# Patient Record
Sex: Female | Born: 1980 | Race: White | Hispanic: No | Marital: Married | Smoking: Never smoker
Health system: Southern US, Community
[De-identification: ages and names within clinical notes are randomized; demographics above are authoritative.]

## PROBLEM LIST (undated history)

## (undated) DIAGNOSIS — G5600 Carpal tunnel syndrome, unspecified upper limb: Secondary | ICD-10-CM

## (undated) DIAGNOSIS — Z87442 Personal history of urinary calculi: Secondary | ICD-10-CM

## (undated) DIAGNOSIS — Z8041 Family history of malignant neoplasm of ovary: Secondary | ICD-10-CM

## (undated) HISTORY — DX: Family history of malignant neoplasm of ovary: Z80.41

## (undated) HISTORY — PX: REDUCTION MAMMAPLASTY: SUR839

## (undated) HISTORY — PX: WISDOM TOOTH EXTRACTION: SHX21

---

## 2014-11-13 LAB — HM PAP SMEAR: HM Pap smear: NORMAL

## 2014-11-29 ENCOUNTER — Emergency Department: Payer: Self-pay | Admitting: Emergency Medicine

## 2015-02-07 ENCOUNTER — Ambulatory Visit
Admit: 2015-02-07 | Disposition: A | Discharge status: Home / Self Care | Payer: Self-pay | Attending: Obstetrics and Gynecology | Admitting: Obstetrics and Gynecology

## 2016-08-20 DIAGNOSIS — N83202 Unspecified ovarian cyst, left side: Secondary | ICD-10-CM

## 2016-08-20 HISTORY — DX: Unspecified ovarian cyst, left side: N83.202

## 2016-08-28 ENCOUNTER — Ambulatory Visit
Admission: RE | Admit: 2016-08-28 | Discharge: 2016-08-28 | Disposition: A | Discharge status: Home / Self Care | Payer: PRIVATE HEALTH INSURANCE | Source: Ambulatory Visit | Attending: Obstetrics and Gynecology | Admitting: Obstetrics and Gynecology

## 2016-08-28 ENCOUNTER — Ambulatory Visit: Payer: PRIVATE HEALTH INSURANCE | Admitting: Certified Registered"

## 2016-08-28 ENCOUNTER — Encounter
Admission: RE | Disposition: A | Discharge status: Home / Self Care | Payer: Self-pay | Source: Ambulatory Visit | Attending: Obstetrics and Gynecology

## 2016-08-28 ENCOUNTER — Encounter: Payer: Self-pay | Admitting: *Deleted

## 2016-08-28 DIAGNOSIS — Z880 Allergy status to penicillin: Secondary | ICD-10-CM | POA: Insufficient documentation

## 2016-08-28 DIAGNOSIS — Z8041 Family history of malignant neoplasm of ovary: Secondary | ICD-10-CM | POA: Insufficient documentation

## 2016-08-28 DIAGNOSIS — N83202 Unspecified ovarian cyst, left side: Secondary | ICD-10-CM | POA: Diagnosis present

## 2016-08-28 HISTORY — PX: LAPAROSCOPIC OVARIAN CYSTECTOMY: SHX6248

## 2016-08-28 HISTORY — DX: Carpal tunnel syndrome, unspecified upper limb: G56.00

## 2016-08-28 HISTORY — PX: LAPAROSCOPY: SHX197

## 2016-08-28 LAB — CBC
HEMATOCRIT: 39.8 % (ref 35.0–47.0)
Hemoglobin: 14.1 g/dL (ref 12.0–16.0)
MCH: 29.9 pg (ref 26.0–34.0)
MCHC: 35.4 g/dL (ref 32.0–36.0)
MCV: 84.4 fL (ref 80.0–100.0)
PLATELETS: 190 10*3/uL (ref 150–440)
RBC: 4.71 MIL/uL (ref 3.80–5.20)
RDW: 14.3 % (ref 11.5–14.5)
WBC: 5.7 10*3/uL (ref 3.6–11.0)

## 2016-08-28 LAB — COMPREHENSIVE METABOLIC PANEL
ALT: 17 U/L (ref 14–54)
AST: 21 U/L (ref 15–41)
Albumin: 4.6 g/dL (ref 3.5–5.0)
Alkaline Phosphatase: 57 U/L (ref 38–126)
Anion gap: 6 (ref 5–15)
BUN: 16 mg/dL (ref 6–20)
CHLORIDE: 105 mmol/L (ref 101–111)
CO2: 26 mmol/L (ref 22–32)
Calcium: 9.3 mg/dL (ref 8.9–10.3)
Creatinine, Ser: 0.74 mg/dL (ref 0.44–1.00)
Glucose, Bld: 93 mg/dL (ref 65–99)
POTASSIUM: 3.9 mmol/L (ref 3.5–5.1)
Sodium: 137 mmol/L (ref 135–145)
TOTAL PROTEIN: 7.9 g/dL (ref 6.5–8.1)
Total Bilirubin: 1.3 mg/dL — ABNORMAL HIGH (ref 0.3–1.2)

## 2016-08-28 LAB — TYPE AND SCREEN
ABO/RH(D): O NEG
Antibody Screen: NEGATIVE

## 2016-08-28 LAB — POCT PREGNANCY, URINE: PREG TEST UR: NEGATIVE

## 2016-08-28 SURGERY — LAPAROSCOPY OPERATIVE
Anesthesia: General

## 2016-08-28 MED ORDER — BUPIVACAINE HCL 0.5 % IJ SOLN
INTRAMUSCULAR | Status: DC | PRN
  Administered 2016-08-28: 10 mL

## 2016-08-28 MED ORDER — MENTHOL 3 MG MT LOZG
1.0000 | LOZENGE | OROMUCOSAL | Status: DC | PRN
Start: 2016-08-28 — End: 2016-08-28
  Filled 2016-08-28: qty 9

## 2016-08-28 MED ORDER — DEXAMETHASONE SODIUM PHOSPHATE 10 MG/ML IJ SOLN
INTRAMUSCULAR | Status: DC | PRN
  Administered 2016-08-28: 4 mg via INTRAVENOUS

## 2016-08-28 MED ORDER — MIDAZOLAM HCL 2 MG/2ML IJ SOLN
INTRAMUSCULAR | Status: DC | PRN
  Administered 2016-08-28: 2 mg via INTRAVENOUS

## 2016-08-28 MED ORDER — FENTANYL CITRATE (PF) 100 MCG/2ML IJ SOLN
INTRAMUSCULAR | Status: AC
  Administered 2016-08-28: 50 ug via INTRAVENOUS
  Filled 2016-08-28: qty 2

## 2016-08-28 MED ORDER — OXYCODONE HCL 5 MG PO TABS: 5.0000 mg | ORAL_TABLET | Freq: Once | ORAL | Status: DC | PRN

## 2016-08-28 MED ORDER — PROPOFOL 10 MG/ML IV BOLUS
INTRAVENOUS | Status: DC | PRN
  Administered 2016-08-28: 160 mg via INTRAVENOUS

## 2016-08-28 MED ORDER — NEOSTIGMINE METHYLSULFATE 10 MG/10ML IV SOLN
INTRAVENOUS | Status: DC | PRN
  Administered 2016-08-28: 4 mg via INTRAVENOUS

## 2016-08-28 MED ORDER — BUPIVACAINE HCL (PF) 0.5 % IJ SOLN
INTRAMUSCULAR | Status: AC
  Filled 2016-08-28: qty 30

## 2016-08-28 MED ORDER — ROCURONIUM BROMIDE 100 MG/10ML IV SOLN
INTRAVENOUS | Status: DC | PRN
  Administered 2016-08-28: 40 mg via INTRAVENOUS
  Administered 2016-08-28: 10 mg via INTRAVENOUS
  Administered 2016-08-28: 5 mg via INTRAVENOUS

## 2016-08-28 MED ORDER — HYDROCODONE-ACETAMINOPHEN 5-325 MG PO TABS: 1.0000 | ORAL_TABLET | ORAL | 0 refills | Status: DC | PRN

## 2016-08-28 MED ORDER — OXYCODONE HCL 5 MG/5ML PO SOLN: 5.0000 mg | Freq: Once | ORAL | Status: DC | PRN

## 2016-08-28 MED ORDER — LACTATED RINGERS IV SOLN
INTRAVENOUS | Status: DC
  Administered 2016-08-28: 10:00:00 via INTRAVENOUS

## 2016-08-28 MED ORDER — GLYCOPYRROLATE 0.2 MG/ML IJ SOLN
INTRAMUSCULAR | Status: DC | PRN
  Administered 2016-08-28: .8 mg via INTRAVENOUS

## 2016-08-28 MED ORDER — FENTANYL CITRATE (PF) 100 MCG/2ML IJ SOLN
INTRAMUSCULAR | Status: DC | PRN
  Administered 2016-08-28: 100 ug via INTRAVENOUS
  Administered 2016-08-28: 50 ug via INTRAVENOUS
  Administered 2016-08-28: 25 ug via INTRAVENOUS
  Administered 2016-08-28: 50 ug via INTRAVENOUS

## 2016-08-28 MED ORDER — PROMETHAZINE HCL 25 MG/ML IJ SOLN: 6.2500 mg | INTRAMUSCULAR | Status: DC | PRN

## 2016-08-28 MED ORDER — LIDOCAINE HCL (CARDIAC) 20 MG/ML IV SOLN
INTRAVENOUS | Status: DC | PRN
  Administered 2016-08-28: 80 mg via INTRAVENOUS

## 2016-08-28 MED ORDER — FENTANYL CITRATE (PF) 100 MCG/2ML IJ SOLN
25.0000 ug | INTRAMUSCULAR | Status: DC | PRN
  Administered 2016-08-28 (×3): 50 ug via INTRAVENOUS

## 2016-08-28 MED ORDER — ONDANSETRON 4 MG PO TBDP: 4.0000 mg | ORAL_TABLET | Freq: Three times a day (TID) | ORAL | 0 refills | Status: DC | PRN

## 2016-08-28 MED ORDER — KETOROLAC TROMETHAMINE 30 MG/ML IJ SOLN
INTRAMUSCULAR | Status: DC | PRN
  Administered 2016-08-28: 30 mg via INTRAVENOUS

## 2016-08-28 MED ORDER — ONDANSETRON HCL 4 MG/2ML IJ SOLN
INTRAMUSCULAR | Status: DC | PRN
  Administered 2016-08-28: 4 mg via INTRAVENOUS

## 2016-08-28 MED ORDER — SODIUM CHLORIDE 0.9 % IJ SOLN
INTRAMUSCULAR | Status: AC
  Filled 2016-08-28: qty 50

## 2016-08-28 MED ORDER — VASOPRESSIN 20 UNIT/ML IV SOLN
INTRAVENOUS | Status: AC
  Filled 2016-08-28: qty 1

## 2016-08-28 MED ORDER — MEPERIDINE HCL 25 MG/ML IJ SOLN: 6.2500 mg | INTRAMUSCULAR | Status: DC | PRN

## 2016-08-28 MED ORDER — METHYLENE BLUE 0.5 % INJ SOLN
INTRAVENOUS | Status: AC
  Filled 2016-08-28: qty 10

## 2016-08-28 MED ORDER — IBUPROFEN 600 MG PO TABS: 600.0000 mg | ORAL_TABLET | Freq: Four times a day (QID) | ORAL | 0 refills | Status: DC | PRN

## 2016-08-28 SURGICAL SUPPLY — 81 items
BAG URINE DRAINAGE (UROLOGICAL SUPPLIES) ×4 IMPLANT
BAG URO DRAIN 2000ML W/SPOUT (MISCELLANEOUS) ×4 IMPLANT
BLADE SURG SZ11 CARB STEEL (BLADE) ×4 IMPLANT
CANISTER SUCT 1200ML W/VALVE (MISCELLANEOUS) ×4 IMPLANT
CATH FOL 2WAY LX 16X5 (CATHETERS) ×4 IMPLANT
CATH FOL LEG HOLDER (MISCELLANEOUS) ×4 IMPLANT
CATH FOLEY 2WAY  5CC 16FR (CATHETERS) ×2
CATH ROBINSON RED A/P 16FR (CATHETERS) IMPLANT
CATH TRAY 16F METER LATEX (MISCELLANEOUS) ×4 IMPLANT
CATH URTH 16FR FL 2W BLN LF (CATHETERS) ×2 IMPLANT
CHLORAPREP W/TINT 26ML (MISCELLANEOUS) IMPLANT
CLOSURE WOUND 1/2 X4 (GAUZE/BANDAGES/DRESSINGS) ×1
DRAPE LAPAROTOMY 100X77 ABD (DRAPES) IMPLANT
DRAPE LAPAROTOMY TRNSV 106X77 (MISCELLANEOUS) IMPLANT
DRAPE LEGGINS SURG 28X43 STRL (DRAPES) ×4 IMPLANT
DRAPE SHEET LG 3/4 BI-LAMINATE (DRAPES) ×4 IMPLANT
DRAPE UNDER BUTTOCK W/FLU (DRAPES) ×4 IMPLANT
DRESSING SURGICEL FIBRLLR 1X2 (HEMOSTASIS) ×2 IMPLANT
DRSG SURGICEL FIBRILLAR 1X2 (HEMOSTASIS) ×4
DRSG TELFA 3X8 NADH (GAUZE/BANDAGES/DRESSINGS) ×4 IMPLANT
ELECT BLADE 6 FLAT ULTRCLN (ELECTRODE) IMPLANT
ELECT CAUTERY BLADE 6.4 (BLADE) IMPLANT
ELECT REM PT RETURN 9FT ADLT (ELECTROSURGICAL) ×4
ELECTRODE REM PT RTRN 9FT ADLT (ELECTROSURGICAL) ×2 IMPLANT
GAUZE SPONGE 4X4 12PLY STRL (GAUZE/BANDAGES/DRESSINGS) ×4 IMPLANT
GLOVE BIO SURGEON STRL SZ7 (GLOVE) ×12 IMPLANT
GLOVE BIO SURGEON STRL SZ8 (GLOVE) ×4 IMPLANT
GLOVE BIOGEL PI IND STRL 7.5 (GLOVE) ×6 IMPLANT
GLOVE BIOGEL PI INDICATOR 7.5 (GLOVE) ×6
GOWN STRL REUS W/ TWL LRG LVL3 (GOWN DISPOSABLE) ×6 IMPLANT
GOWN STRL REUS W/ TWL XL LVL3 (GOWN DISPOSABLE) ×2 IMPLANT
GOWN STRL REUS W/TWL LRG LVL3 (GOWN DISPOSABLE) ×6
GOWN STRL REUS W/TWL XL LVL3 (GOWN DISPOSABLE) ×2
IRRIGATION STRYKERFLOW (MISCELLANEOUS) ×2 IMPLANT
IRRIGATOR STRYKERFLOW (MISCELLANEOUS) ×4
IV LACTATED RINGERS 1000ML (IV SOLUTION) ×4 IMPLANT
IV NS 100ML SINGLE PACK (IV SOLUTION) ×4 IMPLANT
KIT RM TURNOVER CYSTO AR (KITS) ×4 IMPLANT
LABEL OR SOLS (LABEL) ×4 IMPLANT
LIQUID BAND (GAUZE/BANDAGES/DRESSINGS) ×4 IMPLANT
MANIPULATOR VCARE STD CRV RETR (MISCELLANEOUS) ×4 IMPLANT
NDL SAFETY 22GX1.5 (NEEDLE) ×4 IMPLANT
NEEDLE HYPO 25GX1X1/2 BEV (NEEDLE) ×4 IMPLANT
NEEDLE MAYO 6 CRC TAPER PT (NEEDLE) ×4 IMPLANT
NS IRRIG 1000ML POUR BTL (IV SOLUTION) ×4 IMPLANT
NS IRRIG 500ML POUR BTL (IV SOLUTION) ×4 IMPLANT
PACK BASIN MAJOR ARMC (MISCELLANEOUS) ×4 IMPLANT
PACK LAP CHOLECYSTECTOMY (MISCELLANEOUS) ×4 IMPLANT
PAD OB MATERNITY 4.3X12.25 (PERSONAL CARE ITEMS) ×4 IMPLANT
PAD PREP 24X41 OB/GYN DISP (PERSONAL CARE ITEMS) ×4 IMPLANT
POUCH ENDO CATCH 10MM SPEC (MISCELLANEOUS) ×4 IMPLANT
SCISSORS METZENBAUM CVD 33 (INSTRUMENTS) ×4 IMPLANT
SHEARS HARMONIC ACE PLUS 36CM (ENDOMECHANICALS) IMPLANT
SLEEVE ENDOPATH XCEL 5M (ENDOMECHANICALS) ×4 IMPLANT
SOL PREP PVP 2OZ (MISCELLANEOUS) ×4
SOLUTION PREP PVP 2OZ (MISCELLANEOUS) ×2 IMPLANT
SPONGE LAP 18X18 5 PK (GAUZE/BANDAGES/DRESSINGS) ×4 IMPLANT
STAPLER SKIN PROX 35W (STAPLE) ×4 IMPLANT
STRIP CLOSURE SKIN 1/2X4 (GAUZE/BANDAGES/DRESSINGS) ×3 IMPLANT
SURGILUBE 2OZ TUBE FLIPTOP (MISCELLANEOUS) ×4 IMPLANT
SUT ETHIBOND CT1 BRD #0 30IN (SUTURE) IMPLANT
SUT MNCRL 3-0 UNDYED SH (SUTURE) ×2 IMPLANT
SUT MNCRL 4-0 (SUTURE)
SUT MNCRL 4-0 27XMFL (SUTURE)
SUT MONOCRYL 3-0 UNDYED (SUTURE) ×2
SUT VIC AB 0 CT1 27 (SUTURE)
SUT VIC AB 0 CT1 27XCR 8 STRN (SUTURE) IMPLANT
SUT VIC AB 0 CT1 36 (SUTURE) ×4 IMPLANT
SUT VIC AB 1 CT1 36 (SUTURE) IMPLANT
SUT VIC AB 2-0 CT1 (SUTURE) ×4 IMPLANT
SUT VIC AB 2-0 UR6 27 (SUTURE) ×4 IMPLANT
SUT VIC AB 4-0 PS2 18 (SUTURE) IMPLANT
SUTURE MNCRL 4-0 27XMF (SUTURE) IMPLANT
SYR 30ML LL (SYRINGE) ×4 IMPLANT
SYR 50ML LL SCALE MARK (SYRINGE) ×4 IMPLANT
SYRINGE 10CC LL (SYRINGE) ×4 IMPLANT
TRAY PREP VAG/GEN (MISCELLANEOUS) ×4 IMPLANT
TROCAR ENDO BLADELESS 11MM (ENDOMECHANICALS) ×4 IMPLANT
TROCAR XCEL NON-BLD 5MMX100MML (ENDOMECHANICALS) ×4 IMPLANT
TROCAR XCEL UNIV SLVE 11M 100M (ENDOMECHANICALS) IMPLANT
TUBING INSUFFLATOR HI FLOW (MISCELLANEOUS) ×4 IMPLANT

## 2016-08-28 NOTE — Transfer of Care (Signed)
Immediate Anesthesia Transfer of Care Note  Patient: Natalie Wiggins  Procedure(s) Performed: Procedure(s): LAPAROSCOPY OPERATIVE (N/A) LAPAROSCOPIC OVARIAN CYSTECTOMY (Left)  Patient Location: PACU  Anesthesia Type:General  Level of Consciousness: sedated  Airway & Oxygen Therapy: Patient Spontanous Breathing and Patient connected to face mask oxygen  Post-op Assessment: Report given to RN and Post -op Vital signs reviewed and stable  Post vital signs: Reviewed and stable  Last Vitals:  Vitals:   08/28/16 1210 08/28/16 1212  BP:  108/68  Pulse:  71  Resp: (P) 18 19  Temp: (P) 36.2 C     Last Pain:  Vitals:   08/28/16 0921  TempSrc: Tympanic         Complications: No apparent anesthesia complications

## 2016-08-28 NOTE — Anesthesia Postprocedure Evaluation (Signed)
Anesthesia Post Note  Patient: Natalie Wiggins  Procedure(s) Performed: Procedure(s) (LRB): LAPAROSCOPY OPERATIVE (N/A) LAPAROSCOPIC OVARIAN CYSTECTOMY (Left)  Patient location during evaluation: PACU Anesthesia Type: General Level of consciousness: awake and alert and oriented Pain management: pain level controlled Vital Signs Assessment: post-procedure vital signs reviewed and stable Respiratory status: spontaneous breathing, nonlabored ventilation and respiratory function stable Cardiovascular status: blood pressure returned to baseline and stable Postop Assessment: no signs of nausea or vomiting Anesthetic complications: no    Last Vitals:  Vitals:   08/28/16 1353 08/28/16 1436  BP: 104/65 (!) 98/59  Pulse: 78 87  Resp: 16 16  Temp: 36.5 C     Last Pain:  Vitals:   08/28/16 1436  TempSrc:   PainSc: 0-No pain                 Kathee Tumlin

## 2016-08-28 NOTE — Op Note (Signed)
Operative Note    Pre-Op Diagnosis: Left ovarian cyst  Post-Op Diagnosis: Left ovarian cyst  Procedures:  1. Diagnostic laparoscopy 2. Laparoscopic left ovarian cystectomy  Primary Surgeon: Prentice Docker, MD   Assistant Surgeon: Humberto Leep, MD  EBL: 50 mL   IVF: 600 mL   Urine output: 400 mL clear urine at end of case  Specimens:  1) pelvic washings 2) left ovarian cyst wall  Drains: None  Complications: None   Disposition: PACU   Condition: Stable   Findings:  1) Normal appearing uterus, cervix, fallopian tubes, and right ovary 2) left ovary with approximately 7cm cyst with single cavity and clear, yellow fluid 3) Normal-appearing appendix  Procedure Summary:  The patient was taken to the operating room where general anesthesia was administered and found to be adequate. She was placed in the dorsal supine lithotomy position in Wallace stirrups and prepped and draped in usual sterile fashion. After a timeout was called an indwelling catheter was placed in her bladder. A sterile speculum was placed in the vagina and a single-tooth tenaculum was used to grasp the anterior lip of the cervix. An V-Care uterine manipulator was gentle introduce into the uterine cavity in accordance with the manufacturer's recommendations after dilation with Kennon Rounds dilators.  Prior to this the the tenaculum was removed. The speculum was removed from the vagina.  Attention was turned to the abdomen where after injection of local anesthetic, a 5 mm infraumbilical incision was made with the scalpel. Entry into the abdomen was obtained via Optiview trocar technique (a blunt entry technique with camera visualization through the obturator upon entry). Verification of entry into the abdomen was obtained using opening pressures. The abdomen was insufflated with CO2. The camera was introduced through the trocar with verification of atraumatic entry.  An 75mm right lower quadrant port site was created  under direct intra-abdominal camera visualization without issue.  I left lower quadrant 58mm port was placed in a similar fashion without difficulty.  A pelvic washing sample was taken at this point. A general inspection of the abdomen was undertaken at this point with the above-noted findings.  The left ovary was elevated from the pelvis and inspected and found to be free of adhesions.  Using the monopolar scissors the ovarian capsule was scored and then opened.  The cyst wall was revealed.  The ovarian capsule was further opened and dissection of the ovarian cyst wall from the ovarian wall was undertaken. At one particularly shallow point from the cyst wall the capsule wall ruptured.  Suction was utilized the evacuate the contents of the cyst.  The remained of the cyst wall was dissected carefully from the wall of the ovary.  Once the cyst wall was liberated it was removed intact through the 11 mm port site.  Hemostasis was obtained on the exposed ovarian wall with monopolar electrocautery. The remaining ovarian cavity was packed with Fibrillar to maintain hemostasis.  Copious abdominal and pelvic irrigation and evacuation was undertaken.   The 33mm right lower quadrant trocar was removed and the fascia was closed using 2-0 vicryl in a single interrupted stitch.  The abdomen and pelvis were desuflated of CO2. Five deep breaths were given to the patient to maximize CO2 removal.  The remaining two trocars were removed.  The right lower quadrant port skin site was closed in a subcuticular fashion with 4-0 monocryl.  The umbilical port site was reapproximated with a single vertical 4-0 monocryl stitch. The left lower quadrant port site was  reapproximated using a single subcuticular stitch. All port sites were reinforced using surgical skin glue.    The uterine manipulator was removed with hemostasis noted from the cervix and from the tenaculum entry sites. The Foley catheter was removed.  The vaginal was inspected  to ensure no remaining sponges or instruments were present.  The patient tolerated the procedure well.  Sponge, lap, needle, and instrument counts were correct x 2.  VTE prophylaxis: SCDs. Antibiotic prophylaxis: none indicated nor given. She was awakened in the operating room and was taken to the PACU in stable condition.   Prentice Docker, MD 08/28/2016 12:00 PM

## 2016-08-28 NOTE — Anesthesia Preprocedure Evaluation (Signed)
Anesthesia Evaluation  Patient identified by MRN, date of birth, ID band Patient awake    Reviewed: Allergy & Precautions, NPO status , Patient's Chart, lab work & pertinent test results  History of Anesthesia Complications Negative for: history of anesthetic complications  Airway Mallampati: I  TM Distance: >3 FB Neck ROM: Full    Dental no notable dental hx.    Pulmonary neg pulmonary ROS, neg sleep apnea, neg COPD,    breath sounds clear to auscultation- rhonchi (-) wheezing      Cardiovascular Exercise Tolerance: Good (-) hypertension(-) CAD and (-) Past MI  Rhythm:Regular Rate:Normal - Systolic murmurs and - Diastolic murmurs    Neuro/Psych negative psych ROS   GI/Hepatic negative GI ROS, Neg liver ROS,   Endo/Other  negative endocrine ROSneg diabetes  Renal/GU negative Renal ROS     Musculoskeletal negative musculoskeletal ROS (+)   Abdominal (+) - obese,   Peds  Hematology negative hematology ROS (+)   Anesthesia Other Findings   Reproductive/Obstetrics                             Anesthesia Physical Anesthesia Plan  ASA: I  Anesthesia Plan: General   Post-op Pain Management:    Induction: Intravenous  Airway Management Planned: Oral ETT  Additional Equipment:   Intra-op Plan:   Post-operative Plan: Extubation in OR  Informed Consent: I have reviewed the patients History and Physical, chart, labs and discussed the procedure including the risks, benefits and alternatives for the proposed anesthesia with the patient or authorized representative who has indicated his/her understanding and acceptance.   Dental advisory given  Plan Discussed with: CRNA and Anesthesiologist  Anesthesia Plan Comments:         Anesthesia Quick Evaluation

## 2016-08-28 NOTE — H&P (Signed)
GYNECOLOGIC SURGERY ADMISSION HISTORY AND PHYSICAL NOTE    Attending Provider: Will Bonnet, MD   Natalie Wiggins UK:[id number] 08/28/2016 9:45 AM    Chief Complaint:   Natalie Wiggins is a 35 y.o. G27P2002 female with left lower quadrant abdominal pain and a persistent left ovarian cyst who presents for surgical management.  History of Present Ilness:   She has had a cyst on her left ovary for at least two years.  She had several episodes of cyst rupture in late 2015.  She was scheduled to see her OB/GYN about her cyst when she found out that she was pregnant.  Her cyst was about 7cm in the largest dimension prior to and during the early part of her last pregnancy.  After delivering in October 2016, she continued to have symptoms.  In 09/2015 she had a pelvic ultrasound that showed a complex left ovarian cyst measuring 5.6 x 4.2 x 4.8cm.  In 12/2015, she had a repeat ultrasound that showed the cyst measured 6.1 x 5.1 x 5.9 cm and still was described as complex.  On 05/21/2016, the cyst measured 7.0 x 5.4 x 6.4 cm.  She had a CA-125 of 7.5 (normal) in 09/2015.  The CA-125 level was 7.4 in 12/2015.  She was first seen by me in August 2017 for a second opinion.  She presented with worsening symptoms.  Her pain is on her left lower quadrant.  She had another ultrasound this past week showing cyst measuring 7.3 x 3.7 x 6.8 cm and described as complex.  The uterus is anteverted.  She continues to have discomfort and pain.  More functions of daily life are becoming more difficult and painful, including intercourse.  At this point, the cyst has been present for at least two years and appears to be causing a worsening quality of life. She desires to have the cyst removed after a considerable period of observation and conservative management.    Past Medical History:  Diagnosis Date  . Carpal tunnel syndrome    Past Surgical History:  Procedure Laterality Date  . WISDOM TOOTH EXTRACTION     Allergies   Allergen Reactions  . Amoxicillin Hives   Prior to Admission medications   Medication Sig Start Date End Date Taking? Authorizing Provider  b complex vitamins tablet Take 1 tablet by mouth daily.   Yes Historical Provider, MD  Prenatal Vit-Fe Fumarate-FA (MULTIVITAMIN-PRENATAL) 27-0.8 MG TABS tablet Take 1 tablet by mouth daily at 12 noon.   Yes Historical Provider, MD    Obstetric History: She is a G57P2002 female s/p SVD x 2.   Social History:  She  reports that she has never smoked. She has never used smokeless tobacco. She reports that she drinks alcohol. She reports that she does not use drugs.  Family History:  family history includes Hypertension in her father; Ovarian cancer in her maternal aunt.   Review of Systems:  Pertinent positives noted in HPI, otherwise negative x 10 systems reviewed.    Objective    BP 117/87   Pulse 80   Temp 98 F (36.7 C) (Tympanic)   Resp 16   Ht 5\' 6"  (1.676 m)   Wt 150 lb (68 kg)   LMP 08/01/2016   SpO2 100%   BMI 24.21 kg/m  Physical Exam  General:  She is a well appearing female in no distress.  HEENT:  Normocephalic, atraumatic.   Neck:  supple, no lymphadenopathy Cardiac:  RRR Pulmonary:  Clear to auscultation  bilaterally. No wheezes, rales, rhonchi.   Abdomen:  Soft, nontender, nondistended, +BS, no rebound or guarding  Pelvic:  Deferred Extremities:  Non-tender, symmetric no edema bilaterally.   Neurologic:  Alert & oriented x 3.  Appropriate, conversant.    Laboratory Results:   Lab Results  Component Value Date   WBC 5.7 08/28/2016   RBC 4.71 08/28/2016   HGB 14.1 08/28/2016   HCT 39.8 08/28/2016   PLT 190 08/28/2016   Recent Labs     08/28/16  0854  GLUCOSE  93  NA  137  K  3.9  CREATININE  0.74      Lab Results  Component Value Date   PREGTESTUR NEGATIVE 08/28/2016   Assessment & Plan   Natalie Wiggins is a 35 y.o. G40P2002 female with a persistent left ovarian cyst.    Plan:  1.  Surgical removal of  cyst by laparoscopy, possible need to remove the fallopian tube and entire ovary.  Possible need to perform by laparotomy.   2.  See clinic note for more detailed discussion of management options.  Will Bonnet, MD 08/28/2016 9:45 AM

## 2016-08-28 NOTE — Anesthesia Procedure Notes (Signed)
Procedure Name: Intubation Performed by: Aren Cherne Pre-anesthesia Checklist: Patient identified, Patient being monitored, Timeout performed, Emergency Drugs available and Suction available Patient Re-evaluated:Patient Re-evaluated prior to inductionOxygen Delivery Method: Circle system utilized Preoxygenation: Pre-oxygenation with 100% oxygen Intubation Type: IV induction Ventilation: Mask ventilation without difficulty Laryngoscope Size: Mac and 3 Grade View: Grade I Tube type: Oral Tube size: 7.0 mm Number of attempts: 1 Airway Equipment and Method: Stylet Placement Confirmation: ETT inserted through vocal cords under direct vision,  positive ETCO2 and breath sounds checked- equal and bilateral Secured at: 21 cm Tube secured with: Tape Dental Injury: Teeth and Oropharynx as per pre-operative assessment        

## 2016-08-28 NOTE — Discharge Instructions (Signed)

## 2016-08-29 LAB — CYTOLOGY - NON PAP

## 2016-08-29 LAB — SURGICAL PATHOLOGY

## 2016-09-04 ENCOUNTER — Inpatient Hospital Stay: Admission: RE | Admit: 2016-09-04 | Payer: Self-pay | Source: Ambulatory Visit

## 2016-10-20 HISTORY — PX: AUGMENTATION MAMMAPLASTY: SUR837

## 2017-03-03 ENCOUNTER — Ambulatory Visit (INDEPENDENT_AMBULATORY_CARE_PROVIDER_SITE_OTHER): Payer: PRIVATE HEALTH INSURANCE | Admitting: Obstetrics and Gynecology

## 2017-03-03 ENCOUNTER — Ambulatory Visit: Payer: Self-pay | Admitting: Obstetrics and Gynecology

## 2017-03-03 ENCOUNTER — Encounter: Payer: Self-pay | Admitting: Obstetrics and Gynecology

## 2017-03-03 ENCOUNTER — Telehealth: Payer: Self-pay | Admitting: Obstetrics and Gynecology

## 2017-03-03 VITALS — BP 118/74 | Ht 67.0 in | Wt 156.0 lb

## 2017-03-03 DIAGNOSIS — Z308 Encounter for other contraceptive management: Secondary | ICD-10-CM | POA: Diagnosis not present

## 2017-03-03 DIAGNOSIS — N921 Excessive and frequent menstruation with irregular cycle: Secondary | ICD-10-CM

## 2017-03-03 NOTE — Telephone Encounter (Signed)
Pt coming 5/30 at 2:10 with SDJ for mirena insert

## 2017-03-03 NOTE — Telephone Encounter (Signed)
Noted. Will order to arrive by apt date/time. 

## 2017-03-03 NOTE — Progress Notes (Signed)
Obstetrics & Gynecology Office Visit   Chief Complaint  Patient presents with  . Menstrual Problem    pt c/o "horrible cycles"   History of Present Illness: 36 y.o. G51P2002 female who presents to discussed several months of worsening, irregular painful and heavy menstrual bleeding.  She associates headaches and back pain.  This has all been going on since early this year.  She also would like to discuss contraception. She is interested in long-active reversible forms.   Review of Systems  Constitutional: Negative.   HENT: Negative.   Eyes: Negative.   Respiratory: Negative.   Cardiovascular: Negative.   Gastrointestinal: Negative.   Genitourinary:       Per HPI  Musculoskeletal: Negative.   Skin: Negative.   Neurological: Negative.   Psychiatric/Behavioral: Negative.     Past Medical History:  Diagnosis Date  . Carpal tunnel syndrome     Past Surgical History:  Procedure Laterality Date  . LAPAROSCOPIC OVARIAN CYSTECTOMY Left 08/28/2016   Procedure: LAPAROSCOPIC OVARIAN CYSTECTOMY;  Surgeon: Will Bonnet, MD;  Location: [facility] ORS;  Service: Gynecology;  Laterality: Left;  . LAPAROSCOPY N/A 08/28/2016   Procedure: LAPAROSCOPY OPERATIVE;  Surgeon: Will Bonnet, MD;  Location: [facility] ORS;  Service: Gynecology;  Laterality: N/A;  . WISDOM TOOTH EXTRACTION      Gynecologic History: Patient's last menstrual period was 02/23/2017.  Obstetric History: [id number]  Family History  Problem Relation Age of Onset  . Hypertension Father   . Ovarian cancer Maternal Aunt     Social History   Social History  . Marital status: Married    Spouse name: N/A  . Number of children: N/A  . Years of education: N/A   Occupational History  . Not on file.   Social History Main Topics  . Smoking status: Never Smoker  . Smokeless tobacco: Never Used  . Alcohol use Yes  . Drug use: No  . Sexual activity: Yes   Other Topics Concern  . Not on file   Social History Narrative    . No narrative on file    Allergies  Allergen Reactions  . Amoxicillin Hives    Medications:   Medication Sig Start Date End Date Taking? Authorizing Provider  magnesium 30 MG tablet Take 30 mg by mouth 2 (two) times daily.   Yes [provider]  Prenatal Vit-Fe Fumarate-FA (MULTIVITAMIN-PRENATAL) 27-0.8 MG TABS tablet Take 1 tablet by mouth daily at 12 noon.   Yes [provider]  b complex vitamins tablet Take 1 tablet by mouth daily.    [provider]    Physical Exam BP 118/74   Ht 5\' 7"  (1.702 m)   Wt 156 lb (70.8 kg)   LMP 02/23/2017   BMI 24.43 kg/m  Patient's last menstrual period was 02/23/2017. Physical Exam  Constitutional: She is oriented to person, place, and time and well-developed, well-nourished, and in no distress. No distress.  HENT:  Head: Normocephalic and atraumatic.  Pulmonary/Chest: Effort normal. No respiratory distress.  Neurological: She is alert and oriented to person, place, and time.  Psychiatric: Mood, affect and judgment normal.     Assessment: 36 y.o. I4P3295 Menorrhagia with irregular cycle Will perform several labs (CBC, TSH/FT4, prolactin) to get initial assessment.  Ultrasound, if normal for structural issues, though patient has had pelvic imaging about 6 months ago.  Lower on the differential is a cervical polyp. Will assess this when placing IUD.  IUD may also resolve her bleeding issues in the absence  of other obvious etiology.   Encounter for other contraceptive management After discussion of options, patient desires intrauterine device. Will place Mirena at next appointment pending ultrasound findings.    Plan: Problem List Items Addressed This Visit    Encounter for other contraceptive management    After discussion of options, patient desires intrauterine device. Will place Mirena at next appointment pending ultrasound findings.       Menorrhagia with irregular cycle - Primary    Will perform  several labs (CBC, TSH/FT4, prolactin) to get initial assessment.  Ultrasound, if normal for structural issues, though patient has had pelvic imaging about 6 months ago.  Lower on the differential is a cervical polyp. Will assess this when placing IUD.  IUD may also resolve her bleeding issues in the absence of other obvious etiology.       Relevant Orders   Prolactin (Completed)   TSH + free T4 (Completed)   CBC (Completed)   US Transvaginal Non-OB      Return in about 4 weeks (around 03/31/2017) for Mirena IUD placement. and pelvic ultrasound  Prentice Docker, MD 03/10/2017 10:28 AM

## 2017-03-04 LAB — PROLACTIN: PROLACTIN: 4.2 ng/mL — AB (ref 4.8–23.3)

## 2017-03-04 LAB — CBC
HEMATOCRIT: 39.5 % (ref 34.0–46.6)
HEMOGLOBIN: 13.4 g/dL (ref 11.1–15.9)
MCH: 29.3 pg (ref 26.6–33.0)
MCHC: 33.9 g/dL (ref 31.5–35.7)
MCV: 86 fL (ref 79–97)
Platelets: 206 10*3/uL (ref 150–379)
RBC: 4.58 x10E6/uL (ref 3.77–5.28)
RDW: 13.9 % (ref 12.3–15.4)
WBC: 5.3 10*3/uL (ref 3.4–10.8)

## 2017-03-04 LAB — TSH+FREE T4
FREE T4: 1.03 ng/dL (ref 0.82–1.77)
TSH: 1.27 u[IU]/mL (ref 0.450–4.500)

## 2017-03-05 ENCOUNTER — Encounter: Payer: Self-pay | Admitting: Obstetrics and Gynecology

## 2017-03-10 DIAGNOSIS — Z308 Encounter for other contraceptive management: Secondary | ICD-10-CM | POA: Insufficient documentation

## 2017-03-10 DIAGNOSIS — N921 Excessive and frequent menstruation with irregular cycle: Secondary | ICD-10-CM | POA: Insufficient documentation

## 2017-03-10 NOTE — Assessment & Plan Note (Signed)
Will perform several labs (CBC, TSH/FT4, prolactin) to get initial assessment.  Ultrasound, if normal for structural issues, though patient has had pelvic imaging about 6 months ago.  Lower on the differential is a cervical polyp. Will assess this when placing IUD.  IUD may also resolve her bleeding issues in the absence of other obvious etiology.

## 2017-03-10 NOTE — Assessment & Plan Note (Signed)
After discussion of options, patient desires intrauterine device. Will place Mirena at next appointment pending ultrasound findings.

## 2017-03-18 ENCOUNTER — Ambulatory Visit: Payer: Self-pay | Admitting: Obstetrics and Gynecology

## 2017-03-18 ENCOUNTER — Ambulatory Visit (INDEPENDENT_AMBULATORY_CARE_PROVIDER_SITE_OTHER): Payer: PRIVATE HEALTH INSURANCE | Admitting: Obstetrics and Gynecology

## 2017-03-18 ENCOUNTER — Ambulatory Visit (INDEPENDENT_AMBULATORY_CARE_PROVIDER_SITE_OTHER): Payer: PRIVATE HEALTH INSURANCE

## 2017-03-18 ENCOUNTER — Ambulatory Visit: Payer: PRIVATE HEALTH INSURANCE | Admitting: Obstetrics and Gynecology

## 2017-03-18 VITALS — BP 118/74 | Ht 67.0 in | Wt 149.0 lb

## 2017-03-18 DIAGNOSIS — N921 Excessive and frequent menstruation with irregular cycle: Secondary | ICD-10-CM | POA: Diagnosis not present

## 2017-03-18 DIAGNOSIS — Z30430 Encounter for insertion of intrauterine contraceptive device: Secondary | ICD-10-CM | POA: Diagnosis not present

## 2017-03-18 NOTE — Telephone Encounter (Signed)
Mirena Stock reserved for this patient.

## 2017-03-18 NOTE — Progress Notes (Signed)
IUD Insertion Procedure Note Patient identified, informed consent performed, consent signed.   Discussed risks of irregular bleeding, cramping, infection, malpositioning, expulsion or uterine perforation of the IUD (1:1000 placements)  which may require further procedure such as laparoscopy.  IUD while effective at preventing pregnancy do not prevent transmission of sexually transmitted diseases and use of barrier methods for this purpose was discussed. Time out was performed.  Urine pregnancy test negative.  Speculum placed in the vagina.  Cervix visualized.  Cleaned with Betadine x 2.  Grasped anteriorly with a single tooth tenaculum.  Uterus sounded to 7 cm. IUD placed per manufacturer's recommendations.  Strings trimmed to 3 cm. Tenaculum was removed, good hemostasis noted.  Patient tolerated procedure well.   Patient was given post-procedure instructions.  She was advised to have backup contraception for one week.  Patient was also asked to check IUD strings periodically and follow up in 4 weeks for IUD check.   Prentice Docker, MD 03/18/2017 1:38 PM

## 2017-03-26 ENCOUNTER — Telehealth: Payer: Self-pay

## 2017-03-26 NOTE — Telephone Encounter (Signed)
Pt calling - had mirena placed a week and a half ago and is still having issues with it.  Bleeding, daily headaches that don't completely go away c medication.  She can feel exactly where the IUD is on left side - has felt it every day since insertion.  Went to [employer] and was uncomfortable the whole ride down there and back.  Adv it takes three months for the body to adjust to new bc - that could be where the h/as are coming from.  The irreg bleeding is normal as body adjusts. As far as it being uncomfortable, appt made for issues.

## 2017-03-30 ENCOUNTER — Ambulatory Visit (INDEPENDENT_AMBULATORY_CARE_PROVIDER_SITE_OTHER): Payer: PRIVATE HEALTH INSURANCE

## 2017-03-30 ENCOUNTER — Encounter: Payer: Self-pay | Admitting: Obstetrics and Gynecology

## 2017-03-30 ENCOUNTER — Ambulatory Visit (INDEPENDENT_AMBULATORY_CARE_PROVIDER_SITE_OTHER): Payer: PRIVATE HEALTH INSURANCE | Admitting: Obstetrics and Gynecology

## 2017-03-30 VITALS — BP 102/62 | HR 61 | Ht 67.0 in | Wt 152.0 lb

## 2017-03-30 DIAGNOSIS — R1032 Left lower quadrant pain: Secondary | ICD-10-CM

## 2017-03-30 DIAGNOSIS — N921 Excessive and frequent menstruation with irregular cycle: Secondary | ICD-10-CM

## 2017-03-30 DIAGNOSIS — Z30432 Encounter for removal of intrauterine contraceptive device: Secondary | ICD-10-CM | POA: Diagnosis not present

## 2017-03-30 DIAGNOSIS — Z30431 Encounter for routine checking of intrauterine contraceptive device: Secondary | ICD-10-CM | POA: Diagnosis not present

## 2017-03-30 NOTE — Progress Notes (Signed)
    Chief Complaint  Patient presents with  . Contraception    pain/discomfort and abnormal bleeding with IUD     History of Present Illness:  Natalie Wiggins is a 36 y.o. that had a Mirena IUD placed approximately 2 weeks ago. She complains of a sharp LLQ stabbing pain the next day when bending over to pick up a toy. Since that time, she continues to have LLQ jabbing, constant pain, sx worse at night. Pt can't lie on her LT side. She is taking ibup/tylenol with some releif.  Her period also started after IUD placement at the normal time. She is still having a little bleeding. She has also had a headache and nausea since the IUD. She usually gets headaches with her menses, but it's not going away. Improved some with NSAIDs, however.  She also has a hx of LT ovarian cyst 2017 that required surg intervention (but ruptured as Dr. Glennon Mac was taking picture). She had the IUD placed due to irregular and heavy menses. She was really hoping IUD would work for her sx.  Review of Systems  Constitutional: Negative for fever.  Gastrointestinal: Positive for constipation and nausea. Negative for blood in stool, diarrhea and vomiting.  Genitourinary: Positive for pelvic pain and vaginal bleeding. Negative for dyspareunia, dysuria, flank pain, frequency, hematuria, urgency, vaginal discharge and vaginal pain.  Musculoskeletal: Negative for back pain.  Skin: Negative for rash.    Physical Exam:  BP 102/62   Pulse 61   Ht 5\' 7"  (1.702 m)   Wt 152 lb (68.9 kg)   BMI 23.81 kg/m  Body mass index is 23.81 kg/m.  Physical Exam  Vitals reviewed. Constitutional: She is oriented to person, place, and time. She appears well-developed.  Genitourinary: Uterus normal. Uterus is not enlarged and not tender. Cervix exhibits no motion tenderness and no discharge. Right adnexum displays no mass and no tenderness. Left adnexum displays tenderness. Left adnexum displays no mass and no fullness.  Genitourinary  Comments: CX: IUD STRINGS VISIBLE  Neurological: She is alert and oriented to person, place, and time. No cranial nerve deficit.  Psychiatric: She has a normal mood and affect. Her behavior is normal.   GYN u/s--> IUD IN PROPER POSITION; NO FF; ZL=9.35TS; LTO WITH FOLLICLE; RTO WITH 2.6 CM FOLLICLE  Assessment/Plan:  LLQ pain - IUD in place on u/s. LTO with follicle. Discussed watch and wait vs IUD removal. Pt tired of pain and wants removed. IUD removed easily. See if sx resolve. - Plan: US Transvaginal Non-OB  Encounter for routine checking of intrauterine contraceptive device (IUD) - IUD in place but pt wants removed due to pain. - Plan: US Transvaginal Non-OB  Encounter for IUD removal - IUD removed easily. Pt tolerated well.  Menorrhagia with irregular cycle - Pt to f/u with Dr. Glennon Mac re: sx tx.    IUD REMOVAL PROCEDURE   History of Present Illness:  Natalie Wiggins is a 36 y.o. that had a Mirena IUD placed approximately 2 weeks ago.   Pelvic exam:  Two IUD strings present seen coming from the cervical os. EGBUS, vaginal vault and cervix: within normal limits  IUD Removal Strings of IUD identified and grasped.  IUD removed without problem with ring forceps.  Pt tolerated this well.  IUD noted to be intact.  Assessment/Plan:  IUD Removal   [name] B. Jasey Cortez, PA-C 03/30/2017 2:08 PM

## 2017-03-30 NOTE — Progress Notes (Signed)
Review of ULTRASOUND.    I have personally reviewed images and report of recent ultrasound done at [facility].    Plan of management to be discussed with patient.  Barnett Applebaum, MD, [facility], [facility] 03/30/2017  11:24 AM

## 2017-04-17 ENCOUNTER — Ambulatory Visit: Payer: PRIVATE HEALTH INSURANCE | Admitting: Obstetrics and Gynecology

## 2017-07-20 HISTORY — PX: BREAST REDUCTION SURGERY: SHX8

## 2017-12-28 ENCOUNTER — Ambulatory Visit: Payer: PRIVATE HEALTH INSURANCE | Admitting: Obstetrics and Gynecology

## 2017-12-30 ENCOUNTER — Encounter: Payer: Self-pay | Admitting: Obstetrics and Gynecology

## 2017-12-30 ENCOUNTER — Ambulatory Visit (INDEPENDENT_AMBULATORY_CARE_PROVIDER_SITE_OTHER): Payer: PRIVATE HEALTH INSURANCE | Admitting: Obstetrics and Gynecology

## 2017-12-30 VITALS — BP 118/74 | Ht 67.0 in | Wt 150.0 lb

## 2017-12-30 DIAGNOSIS — Z1339 Encounter for screening examination for other mental health and behavioral disorders: Secondary | ICD-10-CM

## 2017-12-30 DIAGNOSIS — Z1331 Encounter for screening for depression: Secondary | ICD-10-CM | POA: Diagnosis not present

## 2017-12-30 DIAGNOSIS — R1032 Left lower quadrant pain: Secondary | ICD-10-CM

## 2017-12-30 DIAGNOSIS — Z124 Encounter for screening for malignant neoplasm of cervix: Secondary | ICD-10-CM | POA: Diagnosis not present

## 2017-12-30 DIAGNOSIS — Z01419 Encounter for gynecological examination (general) (routine) without abnormal findings: Secondary | ICD-10-CM

## 2017-12-30 NOTE — Progress Notes (Signed)
Gynecology Annual Exam   PCP: Foley, Charolotte Eke, MD  Chief Complaint  Patient presents with  . Annual Exam   History of Present Illness:  [name] is a 37 y.o. G2P2002 who LMP was Patient's last menstrual period was 12/11/2017., presents today for her annual examination.  Her menses are regular every 28-30 days, lasting 5 day(s).  Dysmenorrhea severe, occurring first 1-2 days of flow. She does not have intermenstrual bleeding.  She is single partner, contraception - condoms every time.  Last Pap: 10/2014  Results were: no abnormalities /neg HPV DNA negative Hx of STDs: none  Last mammogram: n/a There is no FH of breast cancer. There is a FH of ovarian cancer in her maternal aunt. The patient does do self-breast exams.  Tobacco use: The patient denies current or previous tobacco use. Alcohol use: social drinker Exercise: very active  The patient wears seatbelts: yes.      Has LLQ pain with menses. Is getting worse.  Pain is now 8/10 when it happens.  The pain lasts 5-10 minutes and goes away for rest of menses.  She also can tell when she ovulates because she has pain on the left.  She had a breast reduction in November for back pain. This has resolved.   Past Medical History:  Diagnosis Date  . Carpal tunnel syndrome     Past Surgical History:  Procedure Laterality Date  . BREAST REDUCTION SURGERY  07/2017  . LAPAROSCOPIC OVARIAN CYSTECTOMY Left 08/28/2016   Procedure: LAPAROSCOPIC OVARIAN CYSTECTOMY;  Surgeon: Will Bonnet, MD;  Location: [facility];  Service: Gynecology;  Laterality: Left;  . LAPAROSCOPY N/A 08/28/2016   Procedure: LAPAROSCOPY OPERATIVE;  Surgeon: Will Bonnet, MD;  Location: [facility];  Service: Gynecology;  Laterality: N/A;  . WISDOM TOOTH EXTRACTION      Medications: none    Allergies  Allergen Reactions  . Amoxicillin Hives    Gynecologic History: Patient's last menstrual period was 12/11/2017.  Obstetric History:  [id number]  Social History   Socioeconomic History  . Marital status: Married    Spouse name: Not on file  . Number of children: Not on file  . Years of education: Not on file  . Highest education level: Not on file  Social Needs  . Financial resource strain: Not on file  . Food insecurity - worry: Not on file  . Food insecurity - inability: Not on file  . Transportation needs - medical: Not on file  . Transportation needs - non-medical: Not on file  Occupational History  . Not on file  Tobacco Use  . Smoking status: Never Smoker  . Smokeless tobacco: Never Used  Substance and Sexual Activity  . Alcohol use: Yes  . Drug use: No  . Sexual activity: Yes    Birth control/protection: Condom  Other Topics Concern  . Not on file  Social History Narrative  . Not on file    Family History  Problem Relation Age of Onset  . Hypertension Father   . Ovarian cancer Maternal Aunt     Review of Systems  Constitutional: Negative.   HENT: Negative.   Eyes: Negative.   Respiratory: Negative.   Cardiovascular: Negative.   Gastrointestinal: Negative.   Genitourinary: Negative.   Musculoskeletal: Negative.   Skin: Negative.   Neurological: Negative.   Psychiatric/Behavioral: Negative.      Physical Exam BP 118/74   Ht 5\' 7"  (1.702 m)   Wt 150 lb (68 kg)  LMP 12/11/2017   BMI 23.49 kg/m    Physical Exam  Constitutional: She is oriented to person, place, and time. She appears well-developed and well-nourished. No distress.  Eyes: EOM are normal. No scleral icterus.  Neck: Normal range of motion. Neck supple.  Cardiovascular: Normal rate and regular rhythm.  Pulmonary/Chest: Effort normal and breath sounds normal. No respiratory distress. She has no wheezes. She has no rales.  Abdominal: Soft. Bowel sounds are normal. She exhibits no distension and no mass. There is no tenderness. There is no rebound and no guarding.  Musculoskeletal: Normal range of motion. She exhibits no  edema.  Neurological: She is alert and oriented to person, place, and time. No cranial nerve deficit.  Skin: Skin is warm and dry. No erythema.  Psychiatric: She has a normal mood and affect. Her behavior is normal. Judgment normal.    Female chaperone present for pelvic and breast  portions of the physical exam  Results: AUDIT Questionnaire (screen for alcoholism): 0 PHQ-9: 1   Assessment: 37 y.o. [id number] female here for routine annual gynecologic examination.  Plan: Problem List Items Addressed This Visit    None    Visit Diagnoses    Women's annual routine gynecological examination    -  Primary   Relevant Orders   IGP, Aptima HPV, rfx 16/18,45   Screening for depression       Screening for alcoholism       Pap smear for cervical cancer screening       Relevant Orders   IGP, Aptima HPV, rfx 16/18,45   Left lower quadrant pain       Relevant Orders   IGP, Aptima HPV, rfx 16/18,45   US PELVIS TRANSVANGINAL NON-OB (TV ONLY)      Screening: -- Blood pressure screen normal -- Colonoscopy - not due -- Mammogram - not due -- Weight screening: normal -- Depression screening negative (PHQ-9) -- Nutrition: normal -- cholesterol screening: not due for screening -- osteoporosis screening: not due -- tobacco screening: not using -- alcohol screening: AUDIT questionnaire indicates low-risk usage. -- family history of breast cancer screening: done. not at high risk. -- no evidence of domestic violence or intimate partner violence. -- STD screening: gonorrhea/chlamydia NAAT not collected per patient request. -- pap smear collected per ASCCP guidelines -- HPV vaccination series: not eligilbe   LLQ pain: etiology not obvious. Will obtain pelvic ultrasound with her history of left ovarian cyst.   Prentice Docker, MD 12/30/2017 2:33 PM

## 2018-01-01 LAB — IGP, APTIMA HPV, RFX 16/18,45
HPV Aptima: NEGATIVE
PAP Smear Comment: 0

## 2018-01-06 ENCOUNTER — Encounter: Payer: Self-pay | Admitting: Obstetrics and Gynecology

## 2018-01-06 ENCOUNTER — Ambulatory Visit (INDEPENDENT_AMBULATORY_CARE_PROVIDER_SITE_OTHER): Payer: PRIVATE HEALTH INSURANCE | Admitting: Obstetrics and Gynecology

## 2018-01-06 ENCOUNTER — Ambulatory Visit (INDEPENDENT_AMBULATORY_CARE_PROVIDER_SITE_OTHER): Payer: PRIVATE HEALTH INSURANCE

## 2018-01-06 VITALS — BP 118/74 | Ht 67.0 in | Wt 150.0 lb

## 2018-01-06 DIAGNOSIS — R1032 Left lower quadrant pain: Secondary | ICD-10-CM

## 2018-01-06 NOTE — Progress Notes (Signed)
   Gynecology Ultrasound Follow Up   Chief Complaint  Patient presents with  . Follow-up  Left lower quadrant abdominal pain  History of Present Illness: Patient is a 37 y.o. female who presents today for ultrasound evaluation of the above .  Ultrasound demonstrates the following findings Adnexa: no masses seen  Uterus: anteverted with endometrial stripe  6.7 mm Additional: no abnormal findings  See prior note for details.    Past Medical History:  Diagnosis Date  . Carpal tunnel syndrome     Past Surgical History:  Procedure Laterality Date  . BREAST REDUCTION SURGERY  07/2017  . LAPAROSCOPIC OVARIAN CYSTECTOMY Left 08/28/2016   Procedure: LAPAROSCOPIC OVARIAN CYSTECTOMY;  Surgeon: Will Bonnet, MD;  Location: [facility];  Service: Gynecology;  Laterality: Left;  . LAPAROSCOPY N/A 08/28/2016   Procedure: LAPAROSCOPY OPERATIVE;  Surgeon: Will Bonnet, MD;  Location: [facility];  Service: Gynecology;  Laterality: N/A;  . WISDOM TOOTH EXTRACTION      Family History  Problem Relation Age of Onset  . Hypertension Father   . Ovarian cancer Maternal Aunt     Social History   Socioeconomic History  . Marital status: Married    Spouse name: Not on file  . Number of children: Not on file  . Years of education: Not on file  . Highest education level: Not on file  Social Needs  . Financial resource strain: Not on file  . Food insecurity - worry: Not on file  . Food insecurity - inability: Not on file  . Transportation needs - medical: Not on file  . Transportation needs - non-medical: Not on file  Occupational History  . Not on file  Tobacco Use  . Smoking status: Never Smoker  . Smokeless tobacco: Never Used  Substance and Sexual Activity  . Alcohol use: Yes  . Drug use: No  . Sexual activity: Yes    Birth control/protection: Condom  Other Topics Concern  . Not on file  Social History Narrative  . Not on file    Allergies  Allergen Reactions  .  Amoxicillin Hives    Prior to Admission medications   Medication Sig Start Date End Date Taking? Authorizing Provider  magnesium 30 MG tablet Take 30 mg by mouth 2 (two) times daily.    [provider]  Prenatal Vit-Fe Fumarate-FA (MULTIVITAMIN-PRENATAL) 27-0.8 MG TABS tablet Take 1 tablet by mouth daily at 12 noon.    [provider]    Physical Exam BP 118/74   Ht 5\' 7"  (1.702 m)   Wt 150 lb (68 kg)   LMP 12/11/2017   BMI 23.49 kg/m    General: NAD HEENT: normocephalic, anicteric Pulmonary: No increased work of breathing Extremities: no edema, erythema, or tenderness Neurologic: Grossly intact, normal gait Psychiatric: mood appropriate, affect full   Assessment: 37 y.o. G2P2002  1. Intermittent left lower quadrant abdominal pain      Plan: Problem List Items Addressed This Visit    None    Visit Diagnoses    Intermittent left lower quadrant abdominal pain    -  Primary     Discussed ultrasound findings.  Etiology could be non-gynecologic. Discussed that I have not found an etiology so far. If her pain continues/worsens, my recommendation would be referral to [facility] for further consideration.  Prentice Docker, MD, [facility], [facility] 01/06/2018 3:59 PM

## 2018-03-09 NOTE — Telephone Encounter (Signed)
disreguard ?

## 2018-10-14 ENCOUNTER — Encounter: Payer: Self-pay | Admitting: Emergency Medicine

## 2018-10-14 ENCOUNTER — Ambulatory Visit
Admission: EM | Admit: 2018-10-14 | Discharge: 2018-10-14 | Disposition: A | Discharge status: Home / Self Care | Payer: PRIVATE HEALTH INSURANCE | Attending: Family Medicine | Admitting: Family Medicine

## 2018-10-14 ENCOUNTER — Other Ambulatory Visit: Payer: Self-pay

## 2018-10-14 ENCOUNTER — Ambulatory Visit (INDEPENDENT_AMBULATORY_CARE_PROVIDER_SITE_OTHER): Payer: PRIVATE HEALTH INSURANCE

## 2018-10-14 DIAGNOSIS — B9789 Other viral agents as the cause of diseases classified elsewhere: Secondary | ICD-10-CM | POA: Insufficient documentation

## 2018-10-14 DIAGNOSIS — R0989 Other specified symptoms and signs involving the circulatory and respiratory systems: Secondary | ICD-10-CM

## 2018-10-14 DIAGNOSIS — J069 Acute upper respiratory infection, unspecified: Secondary | ICD-10-CM | POA: Diagnosis not present

## 2018-10-14 DIAGNOSIS — R062 Wheezing: Secondary | ICD-10-CM

## 2018-10-14 DIAGNOSIS — R05 Cough: Secondary | ICD-10-CM

## 2018-10-14 MED ORDER — ALBUTEROL SULFATE HFA 108 (90 BASE) MCG/ACT IN AERS: 1.0000 | INHALATION_SPRAY | Freq: Four times a day (QID) | RESPIRATORY_TRACT | 0 refills | Status: DC | PRN

## 2018-10-14 MED ORDER — HYDROCOD POLST-CPM POLST ER 10-8 MG/5ML PO SUER: 5.0000 mL | Freq: Every evening | ORAL | 0 refills | Status: DC | PRN

## 2018-10-14 MED ORDER — BENZONATATE 200 MG PO CAPS: 200.0000 mg | ORAL_CAPSULE | Freq: Three times a day (TID) | ORAL | 0 refills | Status: DC | PRN

## 2018-10-14 NOTE — ED Provider Notes (Signed)
[facility]    CSN: [id number] Arrival date & time: 10/14/18  1627     History   Chief Complaint Chief Complaint  Patient presents with  . Cough  . Nasal Congestion    HPI Natalie Wiggins is a 37 y.o. female.   The history is provided by the patient.  URI  Presenting symptoms: congestion, cough, fatigue and rhinorrhea   Severity:  Moderate Onset quality:  Sudden Duration:  2 days Timing:  Constant Progression:  Worsening Chronicity:  New Relieved by:  Nothing Ineffective treatments:  OTC medications Associated symptoms: wheezing   Risk factors: not elderly, no chronic cardiac disease, no chronic kidney disease, no chronic respiratory disease and no diabetes mellitus     Past Medical History:  Diagnosis Date  . Carpal tunnel syndrome     Patient Active Problem List   Diagnosis Date Noted  . Encounter for other contraceptive management 03/10/2017  . Menorrhagia with irregular cycle 03/10/2017    Past Surgical History:  Procedure Laterality Date  . BREAST REDUCTION SURGERY  07/2017  . LAPAROSCOPIC OVARIAN CYSTECTOMY Left 08/28/2016   Procedure: LAPAROSCOPIC OVARIAN CYSTECTOMY;  Surgeon: Will Bonnet, MD;  Location: [facility];  Service: Gynecology;  Laterality: Left;  . LAPAROSCOPY N/A 08/28/2016   Procedure: LAPAROSCOPY OPERATIVE;  Surgeon: Will Bonnet, MD;  Location: [facility];  Service: Gynecology;  Laterality: N/A;  . WISDOM TOOTH EXTRACTION      OB History    Gravida  2   Para  2   Term  2   Preterm      AB      Living  2     SAB      TAB      Ectopic      Multiple      Live Births               Home Medications    Prior to Admission medications   Medication Sig Start Date End Date Taking? Authorizing Provider  magnesium 30 MG tablet Take 30 mg by mouth 2 (two) times daily.   Yes [provider]  Prenatal Vit-Fe Fumarate-FA (MULTIVITAMIN-PRENATAL) 27-0.8 MG TABS tablet Take 1 tablet by mouth daily  at 12 noon.   Yes [provider]  albuterol (PROVENTIL HFA;VENTOLIN HFA) 108 (90 Base) MCG/ACT inhaler Inhale 1-2 puffs into the lungs every 6 (six) hours as needed for wheezing or shortness of breath. 10/14/18   Norval Gable, MD  benzonatate (TESSALON) 200 MG capsule Take 1 capsule (200 mg total) by mouth 3 (three) times daily as needed. 10/14/18   Norval Gable, MD  chlorpheniramine-HYDROcodone (TUSSIONEX PENNKINETIC ER) 10-8 MG/5ML SUER Take 5 mLs by mouth at bedtime as needed. 10/14/18   Norval Gable, MD    Family History Family History  Problem Relation Age of Onset  . Hypertension Father   . Ovarian cancer Maternal Aunt     Social History Social History   Tobacco Use  . Smoking status: Never Smoker  . Smokeless tobacco: Never Used  Substance Use Topics  . Alcohol use: Yes  . Drug use: No     Allergies   Amoxicillin   Review of Systems Review of Systems  Constitutional: Positive for fatigue.  HENT: Positive for congestion and rhinorrhea.   Respiratory: Positive for cough and wheezing.      Physical Exam Triage Vital Signs ED Triage Vitals  Enc Vitals Group     BP 10/14/18 1656 110/86  Pulse Rate 10/14/18 1656 68     Resp 10/14/18 1656 18     Temp 10/14/18 1656 98.4 F (36.9 C)     Temp Source 10/14/18 1656 Oral     SpO2 10/14/18 1656 100 %     Weight 10/14/18 1659 145 lb (65.8 kg)     Height 10/14/18 1659 5\' 7"  (1.702 m)     Head Circumference --      Peak Flow --      Pain Score 10/14/18 1659 6     Pain Loc --      Pain Edu? --      Excl. in [place]? --    No data found.  Updated Vital Signs BP 110/86 (BP Location: Left Arm)   Pulse 68   Temp 98.4 F (36.9 C) (Oral)   Resp 18   Ht 5\' 7"  (1.702 m)   Wt 65.8 kg   SpO2 100%   BMI 22.71 kg/m   Visual Acuity Right Eye Distance:   Left Eye Distance:   Bilateral Distance:    Right Eye Near:   Left Eye Near:    Bilateral Near:     Physical Exam Vitals signs and nursing note  reviewed.  Constitutional:      General: She is not in acute distress.    Appearance: She is well-developed. She is not diaphoretic.  HENT:     Head: Normocephalic and atraumatic.     Right Ear: Tympanic membrane, ear canal and external ear normal.     Left Ear: Tympanic membrane, ear canal and external ear normal.     Nose: Rhinorrhea present.     Mouth/Throat:     Pharynx: Oropharynx is clear. Uvula midline. No oropharyngeal exudate or posterior oropharyngeal erythema.  Eyes:     General: No scleral icterus.       Right eye: No discharge.        Left eye: No discharge.     Conjunctiva/sclera: Conjunctivae normal.  Neck:     Musculoskeletal: Normal range of motion and neck supple.     Thyroid: No thyromegaly.  Cardiovascular:     Rate and Rhythm: Normal rate and regular rhythm.     Heart sounds: Normal heart sounds.  Pulmonary:     Effort: Pulmonary effort is normal. No respiratory distress.     Breath sounds: No stridor. Wheezing (few, expiratory) present. No rhonchi or rales.  Lymphadenopathy:     Cervical: No cervical adenopathy.      UC Treatments / Results  Labs (all labs ordered are listed, but only abnormal results are displayed) Labs Reviewed - No data to display  EKG None  Radiology Dg Chest 2 View  Result Date: 10/14/2018 CLINICAL DATA:  Chest congestion, cough EXAM: CHEST - 2 VIEW COMPARISON:  None. FINDINGS: Heart and mediastinal contours are within normal limits. No focal opacities or effusions. No acute bony abnormality. IMPRESSION: No active cardiopulmonary disease. Electronically Signed   By: Rolm Baptise M.D.   On: 10/14/2018 18:21    Procedures Procedures (including critical care time)  Medications Ordered in UC Medications - No data to display  Initial Impression / Assessment and Plan / UC Course  I have reviewed the triage vital signs and the nursing notes.  Pertinent labs & imaging results that were available during my care of the patient  were reviewed by me and considered in my medical decision making (see chart for details).      Final Clinical Impressions(s) /  UC Diagnoses   Final diagnoses:  Viral URI with cough  Wheezing    ED Prescriptions    Medication Sig Dispense Auth. Provider   albuterol (PROVENTIL HFA;VENTOLIN HFA) 108 (90 Base) MCG/ACT inhaler Inhale 1-2 puffs into the lungs every 6 (six) hours as needed for wheezing or shortness of breath. Amelia, MD   chlorpheniramine-HYDROcodone (TUSSIONEX PENNKINETIC ER) 10-8 MG/5ML SUER Take 5 mLs by mouth at bedtime as needed. 60 mL Norval Gable, MD   benzonatate (TESSALON) 200 MG capsule Take 1 capsule (200 mg total) by mouth 3 (three) times daily as needed. 30 capsule Norval Gable, MD     1. x-ray results and diagnosis reviewed with patient 2. rx as per orders above; reviewed possible side effects, interactions, risks and benefits  3. Recommend supportive treatment with rest, fluids 4. Follow-up prn if symptoms worsen or don't improve   Controlled Substance Prescriptions [place] Controlled Substance Registry consulted? Not Applicable   Norval Gable, MD 10/14/18 [phone]

## 2018-10-14 NOTE — ED Triage Notes (Signed)
Patient c/o chest congestion and cough, and nasal congestion that started yesterday. Patient stated she has been taking Mucinex since yesterday for her symptoms.

## 2019-08-10 ENCOUNTER — Encounter: Payer: Self-pay | Admitting: Obstetrics and Gynecology

## 2019-08-10 ENCOUNTER — Ambulatory Visit (INDEPENDENT_AMBULATORY_CARE_PROVIDER_SITE_OTHER): Payer: PRIVATE HEALTH INSURANCE | Admitting: Obstetrics and Gynecology

## 2019-08-10 ENCOUNTER — Other Ambulatory Visit: Payer: Self-pay

## 2019-08-10 VITALS — BP 110/86 | HR 62 | Ht 67.5 in | Wt 139.0 lb

## 2019-08-10 DIAGNOSIS — Z01419 Encounter for gynecological examination (general) (routine) without abnormal findings: Secondary | ICD-10-CM | POA: Diagnosis not present

## 2019-08-10 DIAGNOSIS — Z23 Encounter for immunization: Secondary | ICD-10-CM | POA: Diagnosis not present

## 2019-08-10 DIAGNOSIS — D3910 Neoplasm of uncertain behavior of unspecified ovary: Secondary | ICD-10-CM

## 2019-08-10 DIAGNOSIS — Z1239 Encounter for other screening for malignant neoplasm of breast: Secondary | ICD-10-CM

## 2019-08-10 NOTE — Progress Notes (Signed)
Gynecology Annual Exam   PCP: Foley, Charolotte Eke, MD  Chief Complaint:  Chief Complaint  Patient presents with  . Gynecologic Exam    History of Present Illness: Patient is a 38 y.o. [id number] presents for annual exam. The patient has no complaints today.   LMP: Patient's last menstrual period was 08/05/2019. Average Interval: 21  days Heavy Menses: yes Clots: no Intermenstrual Bleeding: no Postcoital Bleeding: no Dysmenorrhea: no  She had a partial IUD expulsion requiring removal last year.  Her menses were initially irregular but she has completed a cleans over the past few months and with lifestyle changes has achieved weight loss and noted overall regulation in menstrual cycles.  The patient is sexually active. She currently uses rhythm method for contraception. She denies dyspareunia.  The patient does perform self breast exams.  There is no notable family history of breast or ovarian cancer in her family.  The patient wears seatbelts: yes.   The patient has regular exercise: not asked.    The patient denies current symptoms of depression.    Review of Systems: Review of Systems  Constitutional: Negative for chills and fever.  HENT: Negative for congestion.   Respiratory: Negative for cough and shortness of breath.   Cardiovascular: Negative for chest pain and palpitations.  Gastrointestinal: Negative for abdominal pain, constipation, diarrhea, heartburn, nausea and vomiting.  Genitourinary: Negative for dysuria, frequency and urgency.  Skin: Negative for itching and rash.  Neurological: Negative for dizziness and headaches.  Endo/Heme/Allergies: Negative for polydipsia.  Psychiatric/Behavioral: Negative for depression.    Past Medical History:  Past Medical History:  Diagnosis Date  . Carpal tunnel syndrome     Past Surgical History:  Past Surgical History:  Procedure Laterality Date  . BREAST REDUCTION SURGERY  07/2017  . LAPAROSCOPIC OVARIAN CYSTECTOMY  Left 08/28/2016   Procedure: LAPAROSCOPIC OVARIAN CYSTECTOMY;  Surgeon: Will Bonnet, MD;  Location: [facility];  Service: Gynecology;  Laterality: Left;  . LAPAROSCOPY N/A 08/28/2016   Procedure: LAPAROSCOPY OPERATIVE;  Surgeon: Will Bonnet, MD;  Location: [facility];  Service: Gynecology;  Laterality: N/A;  . WISDOM TOOTH EXTRACTION      Gynecologic History:  Patient's last menstrual period was 08/05/2019. 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 Hypertension Father   . Ovarian cancer Maternal Aunt 30    Social History:  Social History   Socioeconomic History  . Marital status: Married    Spouse name: Not on file  . Number of children: Not on file  . Years of education: Not on file  . Highest education level: Not on file  Occupational History  . Not on file  Social Needs  . Financial resource strain: Not on file  . Food insecurity    Worry: Not on file    Inability: Not on file  . Transportation needs    Medical: Not on file    Non-medical: Not on file  Tobacco Use  . Smoking status: Never Smoker  . Smokeless tobacco: Never Used  Substance and Sexual Activity  . Alcohol use: Yes  . Drug use: No  . Sexual activity: Yes    Birth control/protection: Condom  Lifestyle  . Physical activity    Days per week: Not on file  Minutes per session: Not on file  . Stress: Not on file  Relationships  . Social Herbalist on phone: Not on file    Gets together: Not on file    Attends religious service: Not on file    Active member of club or organization: Not on file    Attends meetings of clubs or organizations: Not on file    Relationship status: Not on file  . Intimate partner  violence    Fear of current or ex partner: Not on file    Emotionally abused: Not on file    Physically abused: Not on file    Forced sexual activity: Not on file  Other Topics Concern  . Not on file  Social History Narrative  . Not on file    Allergies:  Allergies  Allergen Reactions  . Amoxicillin Hives    Medications: Prior to Admission medications   Medication Sig Start Date End Date Taking? Authorizing Provider  albuterol (PROVENTIL HFA;VENTOLIN HFA) 108 (90 Base) MCG/ACT inhaler Inhale 1-2 puffs into the lungs every 6 (six) hours as needed for wheezing or shortness of breath. Patient not taking: Reported on 08/10/2019 10/14/18   Norval Gable, MD  benzonatate (TESSALON) 200 MG capsule Take 1 capsule (200 mg total) by mouth 3 (three) times daily as needed. Patient not taking: Reported on 08/10/2019 10/14/18   Norval Gable, MD  chlorpheniramine-HYDROcodone [facility]) 10-8 MG/5ML SUER Take 5 mLs by mouth at bedtime as needed. Patient not taking: Reported on 08/10/2019 10/14/18   Norval Gable, MD  magnesium 30 MG tablet Take 30 mg by mouth 2 (two) times daily.    [provider]  Prenatal Vit-Fe Fumarate-FA (MULTIVITAMIN-PRENATAL) 27-0.8 MG TABS tablet Take 1 tablet by mouth daily at 12 noon.    [provider]    Physical Exam Vitals: Blood pressure 110/86, pulse 62, height 5' 7.5" (1.715 m), weight 139 lb (63 kg), last menstrual period 08/05/2019.  General: NAD, well nourished, appears stated age 89: normocephalic, anicteric Thyroid: no enlargement, no palpable nodules Pulmonary: No increased work of breathing, CTAB Cardiovascular: RRR, distal pulses 2+ Breast: Breast symmetrical, no tenderness, no palpable nodules or masses, no skin or nipple retraction present, no nipple discharge.  No axillary or supraclavicular lymphadenopathy. Abdomen: NABS, soft, non-tender, non-distended.  Umbilicus without lesions.  No hepatomegaly,  splenomegaly or masses palpable. No evidence of hernia  Genitourinary:  External: Normal external female genitalia.  Normal urethral meatus, normal Bartholin's and Skene's glands.    Vagina: Normal vaginal mucosa, no evidence of prolapse.    Cervix: Grossly normal in appearance, no bleeding  Uterus: Non-enlarged, mobile, normal contour.  No CMT  Adnexa: ovaries non-enlarged, no adnexal masses  Rectal: deferred  Lymphatic: no evidence of inguinal lymphadenopathy Extremities: no edema, erythema, or tenderness Neurologic: Grossly intact Psychiatric: mood appropriate, affect full  Female chaperone present for pelvic and breast  portions of the physical exam    Assessment: 38 y.o. [id number] routine annual exam  Plan: Problem List Items Addressed This Visit    None    Visit Diagnoses    Flu vaccine need    -  Primary   Relevant Orders   Flu Vaccine QUAD 36+ mos IM (Completed)   Ovarian tumor of borderline malignancy, unspecified laterality       Relevant Orders   US Transvaginal Non-OB   Encounter for gynecological examination without abnormal finding       Breast screening  2) STI screening  was notoffered and therefore not obtained  2)  ASCCP guidelines and rational discussed.  Patient opts for every 3 years screening interval  3) Contraception - the patient is currently using  rhythm method.  She is happy with her current form of contraception and plans to continue - considering vasectomy  4) Routine healthcare maintenance including cholesterol, diabetes screening discussed managed by PCP  5) History of ovarian cyst - patient states it wasn't cancer but wasn't normal either.  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   Malachy Mood, MD, [facility], [facility] 08/10/2019, 10:46 AM

## 2019-08-15 ENCOUNTER — Telehealth (INDEPENDENT_AMBULATORY_CARE_PROVIDER_SITE_OTHER): Payer: PRIVATE HEALTH INSURANCE | Admitting: Obstetrics and Gynecology

## 2019-08-15 ENCOUNTER — Ambulatory Visit (INDEPENDENT_AMBULATORY_CARE_PROVIDER_SITE_OTHER): Payer: PRIVATE HEALTH INSURANCE

## 2019-08-15 ENCOUNTER — Other Ambulatory Visit: Payer: Self-pay

## 2019-08-15 DIAGNOSIS — N83202 Unspecified ovarian cyst, left side: Secondary | ICD-10-CM

## 2019-08-15 DIAGNOSIS — D3910 Neoplasm of uncertain behavior of unspecified ovary: Secondary | ICD-10-CM

## 2019-08-15 DIAGNOSIS — N8302 Follicular cyst of left ovary: Secondary | ICD-10-CM

## 2019-08-15 NOTE — Progress Notes (Signed)
Virtual Visit via Telephone Note  I connected with Natalie Wiggins on 08/15/2019  at 10:50 AM EDT by telephone and verified that I am speaking with the correct person using two identifiers.   I discussed the limitations, risks, security and privacy concerns of performing an evaluation and management service by telephone and the availability of in person appointments. I also discussed with the patient that there may be a patient responsible charge related to this service. The patient expressed understanding and agreed to proceed.  The patient was at [facility] I spoke with the patient from my  office The names of people involved in this encounter were: Natalie Wiggins and Prentice Docker, MD.   History of Present Illness: 38 y.o. [phone] female with history of benign mucinous cystadenofibroma removed in 2017.    The ultrasound today was negative, apart from a left ovarian dominant follicle that measured 2.5 x 2.0 x 2.1 cm, that was simple in its characteristics.  This is very unlikely to represent a new mass, as she has had no recurrence of the mass.  She had a pelvic ultrasound on 08/18/2018 at [facility] that showed no masses on either ovary.  She denies issues of pain.     Observations/Objective: Physical Exam could not be performed. Because of the COVID-19 outbreak this visit was performed over the phone and not in person.   Imaging Results US Transvaginal Non-ob  Result Date: 08/15/2019 Patient Name: Natalie Wiggins DOB: 07/09/1981 MRN: [id number] ULTRASOUND REPORT Location: [facility] Date of Service: 08/15/2019 Indications: Evaluate for an ovarian cyst Findings: The uterus is retroverted and measures 7.2 x 4.9 x 4.6 cm. Echo texture is homogenous without evidence of focal masses. The Endometrium measures 11.6 mm. Right Ovary measures 2.9 x 2.3 x 1.5 cm. It is normal in appearance. Left Ovary measures 3.4 x 2.5 x 2.3 cm. It is normal in appearance. There is a dominant follicle in the left ovary  measuring 25 x 20 x 21 mm Survey of the adnexa demonstrates no adnexal masses. There is a small amount free fluid in the cul de sac, consistent with physiologic free fluid. Impression: 1. Normal pelvic ultrasound. 2. Left ovarian dominant follicle measuring 2.5 x 2.0 x 2.1 cm. Gweneth Dimitri, RT The ultrasound images and findings were reviewed by me and I agree with the above report. Prentice Docker, MD, [facility], [facility] 08/15/2019 10:00 AM       Assessment and Plan:   ICD-10-CM   1. Left ovarian cyst  N83.202   Resolved from 2017.  Today it appears to be more of a follicle based on size and characteristics. We discussed that it could be a cyst instead of a follicle. However, given the ultrasound characteristics, this is likely to resolve within a few weeks. I did offer a follow up ultrasound in 8-12 weeks, if there is still concern.  Out of an abundance of caution, I discussed (in general terms) this case with a gyn oncology colleague, who recommended I treat the mucinous cystadenofibroma found in 2017 as benign instead of borderline. I consulted [facility] prior to this. However, general mucinous cystadenofibroma was not one of the subtypes of tumors mentioned. Mentioned were mucinous cystadenoma (treat as benign) and mucinous adenofibroma (treat as benign).  I discussed this with the patient who states she feels comfortable not pursuing this further. Of note, the pathology report simply stated "Mucinous cystadenofibroma." The comments of benign, borderline, or malignant were not mentioned.  So, the assumption  was benign.   Follow up as needed or in 1 year for a routine check-up.   Follow Up Instructions: Follow up for routine care or as needed.    I discussed the assessment and treatment plan with the patient. The patient was provided an opportunity to ask questions and all were answered. The patient agreed with the plan and demonstrated an understanding of the  instructions.   The patient was advised to call back or seek an in-person evaluation if the symptoms worsen or if the condition fails to improve as anticipated.  I provided 12:34 minutes of non-face-to-face time during this encounter.  Prentice Docker, MD  [facility], [facility] 08/15/2019 10:05 AM

## 2019-08-22 ENCOUNTER — Encounter: Payer: Self-pay | Admitting: Obstetrics and Gynecology

## 2019-11-17 ENCOUNTER — Ambulatory Visit (INDEPENDENT_AMBULATORY_CARE_PROVIDER_SITE_OTHER): Payer: PRIVATE HEALTH INSURANCE | Admitting: Obstetrics and Gynecology

## 2019-11-17 ENCOUNTER — Encounter: Payer: Self-pay | Admitting: Obstetrics and Gynecology

## 2019-11-17 ENCOUNTER — Other Ambulatory Visit: Payer: Self-pay

## 2019-11-17 VITALS — BP 121/79 | HR 78 | Ht 67.5 in | Wt 144.0 lb

## 2019-11-17 DIAGNOSIS — N920 Excessive and frequent menstruation with regular cycle: Secondary | ICD-10-CM | POA: Diagnosis not present

## 2019-11-17 DIAGNOSIS — G4700 Insomnia, unspecified: Secondary | ICD-10-CM

## 2019-11-17 DIAGNOSIS — R42 Dizziness and giddiness: Secondary | ICD-10-CM | POA: Diagnosis not present

## 2019-11-17 NOTE — Progress Notes (Signed)
Obstetrics & Gynecology Office Visit   Chief Complaint  Patient presents with  . vasomotor S&S    Fatigue, mood swings, anxiety  . Menorrhagia    dizziness w/cycles, nausea   History of Present Illness: 39 y.o. [id number] female who presents for some symptoms related to her periods.  With her menses she was feeling dizzy and lightheaded.  She also has had insomnia.    Her menses have been coming regularly.  Her periods normally last 4 days. It is normal for her to pass some clots. However, this last menses she had much larger clots.  She denies dysmenorrhea. She has insomnia that has been going on for a while that she has with her periods. She slept way more than she usually does during her last period due to extreme fatigue.   The most recent menses lasted 7 days and she was passing larger clots.   In November she had a cycle where she felt dizzy and highly fatigued.  She has also felt anxious.    One of her sisters started menopause at age 76-37 years. Her other sister started menopause at age 40-42 years. Her mother went through menopause at the age of 60-47.    The thing that bothers her most is what she would describe as depression.  This usually starts about a day prior to her period.  This past month she states that her focus was off.  There is a lot of stress in her life.  Her husband has a lot of stress around work (ER physician) and [phone].  She very actively creates space in her life for balance. She exercises regularly, she has a very healthy diet, she takes dietary supplementation.  She does not believe counseling would help with any symptoms of depression or anxiety that she might be having. She is also not interested in trying medication to treat depressive or any other mood symptoms.   Past Medical History:  Diagnosis Date  . Carpal tunnel syndrome   . Family history of ovarian cancer    cancer genetic testing letter sent    Past Surgical History:  Procedure Laterality  Date  . BREAST REDUCTION SURGERY  07/2017  . LAPAROSCOPIC OVARIAN CYSTECTOMY Left 08/28/2016   Procedure: LAPAROSCOPIC OVARIAN CYSTECTOMY;  Surgeon: Will Bonnet, MD;  Location: [facility] ORS;  Service: Gynecology;  Laterality: Left;  . LAPAROSCOPY N/A 08/28/2016   Procedure: LAPAROSCOPY OPERATIVE;  Surgeon: Will Bonnet, MD;  Location: [facility] ORS;  Service: Gynecology;  Laterality: N/A;  . WISDOM TOOTH EXTRACTION      Gynecologic History: Patient's last menstrual period was 11/10/2019.  Obstetric History: [id number]  Family History  Problem Relation Age of Onset  . Hypertension Father   . Ovarian cancer Maternal Aunt 30    Social History   Socioeconomic History  . Marital status: Married    Spouse name: Not on file  . Number of children: Not on file  . Years of education: Not on file  . Highest education level: Not on file  Occupational History  . Not on file  Tobacco Use  . Smoking status: Never Smoker  . Smokeless tobacco: Never Used  Substance and Sexual Activity  . Alcohol use: Yes  . Drug use: No  . Sexual activity: Yes    Birth control/protection: Condom  Other Topics Concern  . Not on file  Social History Narrative  . Not on file   Social Determinants of Health   Financial Resource Strain:   .  Difficulty of Paying Living Expenses: Not on file  Food Insecurity:   . Worried About Charity fundraiser in the Last Year: Not on file  . Ran Out of Food in the Last Year: Not on file  Transportation Needs:   . Lack of Transportation (Medical): Not on file  . Lack of Transportation (Non-Medical): Not on file  Physical Activity:   . Days of Exercise per Week: Not on file  . Minutes of Exercise per Session: Not on file  Stress:   . Feeling of Stress : Not on file  Social Connections:   . Frequency of Communication with Friends and Family: Not on file  . Frequency of Social Gatherings with Friends and Family: Not on file  . Attends Religious Services: Not on file   . Active Member of Clubs or Organizations: Not on file  . Attends Archivist Meetings: Not on file  . Marital Status: Not on file  Intimate Partner Violence:   . Fear of Current or Ex-Partner: Not on file  . Emotionally Abused: Not on file  . Physically Abused: Not on file  . Sexually Abused: Not on file    Allergies  Allergen Reactions  . Amoxicillin Hives    Prior to Admission medications   Medication Sig Start Date End Date Taking? Authorizing Provider  magnesium 30 MG tablet Take 30 mg by mouth 2 (two) times daily.   Yes [provider]    Review of Systems  Constitutional: Positive for malaise/fatigue (with menses, as noted in the HPI). Negative for chills, diaphoresis, fever and weight loss.  HENT: Negative.   Eyes: Negative.   Respiratory: Negative.   Cardiovascular: Negative.   Gastrointestinal: Negative.  Negative for abdominal pain.  Genitourinary: Negative.   Musculoskeletal: Negative.   Skin: Negative.   Neurological: Positive for dizziness. Negative for tingling, tremors, sensory change, speech change, focal weakness, seizures, weakness and headaches.  Psychiatric/Behavioral: Positive for depression. Negative for hallucinations, memory loss, substance abuse and suicidal ideas. The patient is nervous/anxious and has insomnia.     Physical Exam BP 121/79 (BP Location: Left Arm, Patient Position: Sitting, Cuff Size: Normal)   Pulse 78   Ht 5' 7.5" (1.715 m)   Wt 144 lb (65.3 kg)   LMP 11/10/2019   BMI 22.22 kg/m  Patient's last menstrual period was 11/10/2019. Physical Exam Constitutional:      General: She is not in acute distress.    Appearance: Normal appearance.  HENT:     Head: Normocephalic and atraumatic.  Eyes:     General: No scleral icterus.    Conjunctiva/sclera: Conjunctivae normal.  Neurological:     General: No focal deficit present.     Mental Status: She is alert and oriented to person, place, and time.     Cranial  Nerves: No cranial nerve deficit.  Psychiatric:        Mood and Affect: Mood normal.        Behavior: Behavior normal.        Judgment: Judgment normal.    Assessment: 39 y.o. [id number] female here for  1. Menorrhagia with regular cycle      Plan: Problem List Items Addressed This Visit    None    Visit Diagnoses    Menorrhagia with regular cycle    -  Primary   Relevant Orders   Anti mullerian hormone   Follicle stimulating hormone     Her overall diagnosis is a hard one to classify.  She has multiple symptoms that seem to occur around the time of her menstruation.  She particularly notes depressive symptoms, some anxiety, fatigue, mood swings, feeling dizzy, and nausea.  Certainly some of these symptoms would be consistent with premenstrual dysphoric disorder (PMDD), though not all symptoms could be explained with this. She has had one recent period that lasted longer and was heavier than is typical for her.  We discussed many topics related to her menses and symptoms as potential etiologies. These include; menopausal transition, PMDD, stress affecting her menses, heavy menses.  She has a very healthy lifestyle and is not interested in trying therapy/counseling as she believes the benefit would be negligible.  We discussed having her see a naturopathic practitioner who might better align with her current health practices versus an allopathic practitioner, who is more likely to prescribe medication, as she does not want to take any medication for her symptoms.  I don't think this would necessarily be a bad avenue to take, as it appears to align well with her beliefs surrounding health.  There are times when allopathic medicine is the best approach and times when a close examination of holistic factors might be more beneficial.  For control of her heavy menses, this could be a stress-related reaction to her endocrine system that is manifesting in her current symptoms.  We discussed lightening her  menses and she seems interested in trying a Mirena IUD for this. After discussion, she would like to give this a try.  We will schedule this for when she returns from a family trip in February.  Given her strong family history, will test for ovarian reserve with an [facility]H and will test an [facility].    25 minutes spent in face to face discussion with 100% spent in counseling,management, and coordination of care of her menorrhagia with regular cycle and her other symptoms.   Prentice Docker, MD 11/17/2019 5:25 PM

## 2019-11-25 LAB — FOLLICLE STIMULATING HORMONE: FSH: 5.9 m[IU]/mL

## 2019-11-25 LAB — ANTI MULLERIAN HORMONE: ANTI-MULLERIAN HORMONE (AMH): 0.482 ng/mL

## 2019-12-02 ENCOUNTER — Telehealth: Payer: Self-pay | Admitting: Obstetrics and Gynecology

## 2019-12-02 NOTE — Telephone Encounter (Signed)
Patient is reschedule to 01/11/20 with [facility] for Mirena placement. Patient was schedule 01/12/20 in [place] schedule change will be in surgery. Left voicemail for patient to call back to confirm

## 2019-12-05 NOTE — Telephone Encounter (Signed)
Called and left voice mail for patient to call back to be schedule °

## 2019-12-14 ENCOUNTER — Encounter: Payer: Self-pay | Admitting: Obstetrics and Gynecology

## 2019-12-14 ENCOUNTER — Other Ambulatory Visit: Payer: Self-pay

## 2019-12-14 ENCOUNTER — Ambulatory Visit (INDEPENDENT_AMBULATORY_CARE_PROVIDER_SITE_OTHER): Payer: PRIVATE HEALTH INSURANCE | Admitting: Obstetrics and Gynecology

## 2019-12-14 VITALS — BP 118/74 | Wt 147.0 lb

## 2019-12-14 DIAGNOSIS — Z30430 Encounter for insertion of intrauterine contraceptive device: Secondary | ICD-10-CM | POA: Diagnosis not present

## 2019-12-14 DIAGNOSIS — N920 Excessive and frequent menstruation with regular cycle: Secondary | ICD-10-CM

## 2019-12-14 MED ORDER — LEVONORGESTREL 20 MCG/24HR IU IUD: 1.0000 | INTRAUTERINE_SYSTEM | Freq: Once | INTRAUTERINE | 0 refills | Status: DC

## 2019-12-14 NOTE — Progress Notes (Signed)
    IUD Insertion Procedure Note Patient identified, informed consent performed, consent signed.   Discussed risks of irregular bleeding, cramping, infection, malpositioning, expulsion or uterine perforation of the IUD (1:1000 placements)  which may require further procedure such as laparoscopy.  IUD while effective at preventing pregnancy do not prevent transmission of sexually transmitted diseases and use of barrier methods for this purpose was discussed. Time out was performed.    Speculum placed in the vagina.  Cervix visualized.  Cleaned with Betadine x 2.  Grasped anteriorly with a single tooth tenaculum.  Uterus sounded to 8 cm. IUD placed per manufacturer's recommendations.  Strings trimmed to 3 cm. Tenaculum was removed, good hemostasis noted.  Patient tolerated procedure well.   Patient was given post-procedure instructions.  She was advised to have backup contraception for one week.  Patient was also asked to check IUD strings periodically and follow up in 4 weeks for IUD check.   Prentice Docker, MD, [facility], [facility] 12/14/2019 9:14 AM

## 2019-12-15 ENCOUNTER — Ambulatory Visit: Payer: PRIVATE HEALTH INSURANCE | Admitting: Obstetrics and Gynecology

## 2020-01-12 ENCOUNTER — Encounter: Payer: Self-pay | Admitting: Obstetrics and Gynecology

## 2020-01-12 ENCOUNTER — Other Ambulatory Visit: Payer: Self-pay

## 2020-01-12 ENCOUNTER — Ambulatory Visit: Payer: PRIVATE HEALTH INSURANCE | Admitting: Obstetrics and Gynecology

## 2020-01-12 VITALS — BP 111/82 | Wt 148.0 lb

## 2020-01-12 DIAGNOSIS — N920 Excessive and frequent menstruation with regular cycle: Secondary | ICD-10-CM

## 2020-01-12 DIAGNOSIS — Z30432 Encounter for removal of intrauterine contraceptive device: Secondary | ICD-10-CM

## 2020-01-12 DIAGNOSIS — K5909 Other constipation: Secondary | ICD-10-CM

## 2020-01-12 NOTE — Progress Notes (Signed)
  IUD String Check  Subjctive: Natalie Wiggins presents for IUD string check.  She had a Mirena placed 4 weeks ago.  Since placement of her IUD she has had constipation and has had to take magnesium citrate to affect a bowel movement.  She would like to have the IUD removed today. She is 100% sure. She voices no other acute complaints beyond difficulty sleeping and bloating.   Objective: BP 111/82   Wt 148 lb (67.1 kg)   BMI 22.84 kg/m  Physical Exam Constitutional:      General: She is not in acute distress.    Appearance: Normal appearance.  Genitourinary:     Pelvic exam was performed with patient in the lithotomy position.     Vulva, inguinal canal, urethra, bladder, vagina and cervix normal.     IUD strings visualized.  HENT:     Head: Normocephalic and atraumatic.  Eyes:     General: No scleral icterus.    Conjunctiva/sclera: Conjunctivae normal.  Neurological:     General: No focal deficit present.     Mental Status: She is alert and oriented to person, place, and time.     Cranial Nerves: No cranial nerve deficit.  Psychiatric:        Mood and Affect: Mood normal.        Behavior: Behavior normal.        Judgment: Judgment normal.    IUD Removal  Patient identified, informed consent performed, consent signed.  Patient was in the dorsal lithotomy position, normal external genitalia was noted.  A speculum was placed in the patient's vagina, normal discharge was noted, no lesions. The cervix was visualized, no lesions, no abnormal discharge.  The strings of the IUD were grasped and pulled using ring forceps. The IUD was removed in its entirety. Patient tolerated the procedure well.    Female chaperone was present for the entirety of the pelvic exam  Assessment: 39 y.o. year old female status post prior Mirena IUD placement 4 week ago, with constipation since placement and bloating. She desired to have the IUD removed, which was accomplished today without difficulty.   Plan: Follow up, as needed.   Prentice Docker, MD 01/12/2020 9:01 AM

## 2020-01-18 ENCOUNTER — Ambulatory Visit: Payer: PRIVATE HEALTH INSURANCE | Admitting: Obstetrics and Gynecology

## 2020-07-31 ENCOUNTER — Other Ambulatory Visit: Payer: Self-pay | Admitting: Gerontology

## 2020-07-31 DIAGNOSIS — Z87442 Personal history of urinary calculi: Secondary | ICD-10-CM

## 2020-07-31 DIAGNOSIS — K581 Irritable bowel syndrome with constipation: Secondary | ICD-10-CM

## 2020-08-15 ENCOUNTER — Ambulatory Visit
Admission: RE | Admit: 2020-08-15 | Discharge: 2020-08-15 | Disposition: A | Discharge status: Home / Self Care | Payer: PRIVATE HEALTH INSURANCE | Source: Ambulatory Visit | Attending: Gerontology | Admitting: Gerontology

## 2020-08-15 ENCOUNTER — Other Ambulatory Visit: Payer: Self-pay

## 2020-08-15 DIAGNOSIS — K581 Irritable bowel syndrome with constipation: Secondary | ICD-10-CM | POA: Diagnosis present

## 2020-08-15 DIAGNOSIS — Z87442 Personal history of urinary calculi: Secondary | ICD-10-CM | POA: Diagnosis not present

## 2021-01-18 ENCOUNTER — Ambulatory Visit
Admission: EM | Admit: 2021-01-18 | Discharge: 2021-01-18 | Disposition: A | Discharge status: Home / Self Care | Payer: PRIVATE HEALTH INSURANCE | Attending: Physician Assistant | Admitting: Physician Assistant

## 2021-01-18 ENCOUNTER — Encounter: Payer: Self-pay | Admitting: Emergency Medicine

## 2021-01-18 ENCOUNTER — Other Ambulatory Visit: Payer: Self-pay

## 2021-01-18 DIAGNOSIS — N898 Other specified noninflammatory disorders of vagina: Secondary | ICD-10-CM | POA: Diagnosis present

## 2021-01-18 DIAGNOSIS — R300 Dysuria: Secondary | ICD-10-CM | POA: Diagnosis present

## 2021-01-18 LAB — URINALYSIS, COMPLETE (UACMP) WITH MICROSCOPIC
Bilirubin Urine: NEGATIVE
Glucose, UA: NEGATIVE mg/dL
Hgb urine dipstick: NEGATIVE
Ketones, ur: NEGATIVE mg/dL
Leukocytes,Ua: NEGATIVE
Nitrite: NEGATIVE
Protein, ur: NEGATIVE mg/dL
RBC / HPF: NONE SEEN RBC/hpf (ref 0–5)
Specific Gravity, Urine: 1.015 (ref 1.005–1.030)
pH: 7 (ref 5.0–8.0)

## 2021-01-18 LAB — WET PREP, GENITAL
Clue Cells Wet Prep HPF POC: NONE SEEN
Sperm: NONE SEEN
Trich, Wet Prep: NONE SEEN
WBC, Wet Prep HPF POC: NONE SEEN
Yeast Wet Prep HPF POC: NONE SEEN

## 2021-01-18 NOTE — ED Provider Notes (Signed)
[facility]    CSN: [id number] Arrival date & time: 01/18/21  1331      History   Chief Complaint Chief Complaint  Patient presents with  . Dysuria    HPI Natalie Wiggins is a 40 y.o. female presenting for dysuria and urinary urgency that started yesterday.  Patient denies any fever, fatigue, chills, abdominal or pelvic pain, back pain, hematuria, or concern for STIs.  Last menstrual period was 01/04/2021.  She does admit to some vaginal itching and discharge.  Denies any odor.  Patient stated she wants to get checked out for possible UTI since it is about to be the weekend and she does not want to have a UTI during the weekend.  No other concerns.  HPI  Past Medical History:  Diagnosis Date  . Carpal tunnel syndrome   . Family history of ovarian cancer    cancer genetic testing letter sent    Patient Active Problem List   Diagnosis Date Noted  . Encounter for other contraceptive management 03/10/2017  . Menorrhagia with irregular cycle 03/10/2017  . Left ovarian cyst 08/28/2016    Past Surgical History:  Procedure Laterality Date  . BREAST REDUCTION SURGERY  07/2017  . LAPAROSCOPIC OVARIAN CYSTECTOMY Left 08/28/2016   Procedure: LAPAROSCOPIC OVARIAN CYSTECTOMY;  Surgeon: Will Bonnet, MD;  Location: [facility] ORS;  Service: Gynecology;  Laterality: Left;  . LAPAROSCOPY N/A 08/28/2016   Procedure: LAPAROSCOPY OPERATIVE;  Surgeon: Will Bonnet, MD;  Location: [facility] ORS;  Service: Gynecology;  Laterality: N/A;  . WISDOM TOOTH EXTRACTION      OB History    Gravida  2   Para  2   Term  2   Preterm      AB      Living  2     SAB      IAB      Ectopic      Multiple      Live Births               Home Medications    Prior to Admission medications   Medication Sig Start Date End Date Taking? Authorizing Provider  magnesium 30 MG tablet Take 30 mg by mouth 2 (two) times daily.   Yes [provider]  levonorgestrel (MIRENA) 20  MCG/24HR IUD 1 Intra Uterine Device (1 each total) by Intrauterine route once for 1 dose. 12/14/19 12/14/19  Will Bonnet, MD    Family History Family History  Problem Relation Age of Onset  . Hypertension Father   . Ovarian cancer Maternal Aunt 30    Social History Social History   Tobacco Use  . Smoking status: Never Smoker  . Smokeless tobacco: Never Used  Vaping Use  . Vaping Use: Never used  Substance Use Topics  . Alcohol use: Yes  . Drug use: No     Allergies   Amoxicillin   Review of Systems Review of Systems  Constitutional: Negative for chills, fatigue and fever.  Gastrointestinal: Negative for abdominal pain, diarrhea, nausea and vomiting.  Genitourinary: Positive for dysuria, frequency, urgency and vaginal discharge. Negative for decreased urine volume, flank pain, hematuria, pelvic pain, vaginal bleeding and vaginal pain.  Musculoskeletal: Negative for back pain.  Skin: Negative for rash.     Physical Exam Triage Vital Signs ED Triage Vitals  Enc Vitals Group     BP 01/18/21 1346 116/81     Pulse Rate 01/18/21 1346 71     Resp 01/18/21  1346 14     Temp 01/18/21 1346 98 F (36.7 C)     Temp Source 01/18/21 1346 Oral     SpO2 01/18/21 1346 100 %     Weight 01/18/21 1342 140 lb (63.5 kg)     Height 01/18/21 1342 5' 7.5" (1.715 m)     Head Circumference --      Peak Flow --      Pain Score 01/18/21 1342 3     Pain Loc --      Pain Edu? --      Excl. in [place]? --    No data found.  Updated Vital Signs BP 116/81 (BP Location: Left Arm)   Pulse 71   Temp 98 F (36.7 C) (Oral)   Resp 14   Ht 5' 7.5" (1.715 m)   Wt 140 lb (63.5 kg)   LMP 01/04/2021 (Approximate)   SpO2 100%   BMI 21.60 kg/m      Physical Exam Vitals and nursing note reviewed.  Constitutional:      General: She is not in acute distress.    Appearance: Normal appearance. She is not ill-appearing or toxic-appearing.  HENT:     Head: Normocephalic and atraumatic.   Eyes:     General: No scleral icterus.       Right eye: No discharge.        Left eye: No discharge.     Conjunctiva/sclera: Conjunctivae normal.  Cardiovascular:     Rate and Rhythm: Normal rate and regular rhythm.  Pulmonary:     Effort: Pulmonary effort is normal. No respiratory distress.  Abdominal:     Palpations: Abdomen is soft.     Tenderness: There is no abdominal tenderness.  Musculoskeletal:     Cervical back: Neck supple.  Skin:    General: Skin is dry.  Neurological:     General: No focal deficit present.     Mental Status: She is alert. Mental status is at baseline.     Motor: No weakness.     Gait: Gait normal.  Psychiatric:        Mood and Affect: Mood normal.        Behavior: Behavior normal.        Thought Content: Thought content normal.      UC Treatments / Results  Labs (all labs ordered are listed, but only abnormal results are displayed) Labs Reviewed  URINALYSIS, COMPLETE (UACMP) WITH MICROSCOPIC - Abnormal; Notable for the following components:      Result Value   Bacteria, UA FEW (*)    All other components within normal limits  URINE CULTURE  WET PREP, GENITAL    EKG   Radiology No results found.  Procedures Procedures (including critical care time)  Medications Ordered in UC Medications - No data to display  Initial Impression / Assessment and Plan / UC Course  I have reviewed the triage vital signs and the nursing notes.  Pertinent labs & imaging results that were available during my care of the patient were reviewed by me and considered in my medical decision making (see chart for details).   40 year old female presenting for dysuria and urinary urgency since yesterday.  All vital signs are normal and stable.  Exam is benign.  No red flag signs or symptoms elicited on history and physical.  Urinalysis performed today Urine culture will be sent.  Advised patient we will contact her if therapy needs to change. Wet prep  obtained.  Patient  had to leave to go pick up her child.  I advised her that I will call if there is an abnormality with the wet prep.  Advised supportive care at this time with increased rest and fluids.  Advised over-the-counter AZO for burning discomfort.  Reviewed ED red flag signs and symptoms for UTIs.]  Negative wet prep.    Final Clinical Impressions(s) / UC Diagnoses   Final diagnoses:  Dysuria  Vaginal itching     Discharge Instructions     The urinalysis does not show evidence of UTI, but this is not 100% ruled out.  I will send the urine for culture and if bacteria grows, we can call in an antibiotic.  For this time you can take Pyridium or AZO for the symptoms.  Increase rest and fluids.   I will call you with results of vaginal swab if abnormality.    ED Prescriptions    None     PDMP not reviewed this encounter.   Danton Clap, PA-C 01/18/21 1458

## 2021-01-18 NOTE — Discharge Instructions (Signed)
The urinalysis does not show evidence of UTI, but this is not 100% ruled out.  I will send the urine for culture and if bacteria grows, we can call in an antibiotic.  For this time you can take Pyridium or AZO for the symptoms.  Increase rest and fluids.   I will call you with results of vaginal swab if abnormality.

## 2021-01-18 NOTE — ED Triage Notes (Signed)
Patient c/o burning when urinating and urinary urgency that started yesterday.

## 2021-01-21 LAB — URINE CULTURE: Culture: NO GROWTH

## 2021-04-25 ENCOUNTER — Ambulatory Visit (INDEPENDENT_AMBULATORY_CARE_PROVIDER_SITE_OTHER): Payer: No Typology Code available for payment source | Admitting: Obstetrics and Gynecology

## 2021-04-25 ENCOUNTER — Other Ambulatory Visit: Payer: Self-pay

## 2021-04-25 ENCOUNTER — Encounter: Payer: Self-pay | Admitting: Obstetrics and Gynecology

## 2021-04-25 VITALS — BP 134/85 | HR 65 | Resp 16 | Ht 67.5 in | Wt 153.4 lb

## 2021-04-25 DIAGNOSIS — N921 Excessive and frequent menstruation with irregular cycle: Secondary | ICD-10-CM

## 2021-04-25 DIAGNOSIS — N859 Noninflammatory disorder of uterus, unspecified: Secondary | ICD-10-CM | POA: Diagnosis not present

## 2021-04-25 DIAGNOSIS — R300 Dysuria: Secondary | ICD-10-CM

## 2021-04-25 DIAGNOSIS — R109 Unspecified abdominal pain: Secondary | ICD-10-CM | POA: Diagnosis not present

## 2021-04-25 DIAGNOSIS — G8929 Other chronic pain: Secondary | ICD-10-CM

## 2021-04-25 DIAGNOSIS — N281 Cyst of kidney, acquired: Secondary | ICD-10-CM

## 2021-04-25 LAB — POCT URINALYSIS DIPSTICK
Blood, UA: NEGATIVE
Glucose, UA: NEGATIVE
Ketones, UA: POSITIVE
Nitrite, UA: NEGATIVE
Protein, UA: NEGATIVE
Spec Grav, UA: 1.02 (ref 1.010–1.025)
Urobilinogen, UA: 0.2 E.U./dL
pH, UA: 5 (ref 5.0–8.0)

## 2021-04-25 NOTE — Progress Notes (Addendum)
Obstetrics & Gynecology Office Visit   Chief Complaint  Patient presents with   Gynecologic Exam    History of Present Illness: 40 y.o. G41P2002 female who presents with back pain, LLQ tenderness.    The back pain started on 6/3 with left sided back pain.  The pain location is described as in her lower back.  She had not had back pain like that. She was on her period at that time.  The pain continued. She continued spotting with her period (which is abnormal for her).  She started bleeding again on 6/25 (which was 16 days after her last one started), which is very unusual for her. This lasted 7 days.  Since this time she has had a lot of tenderness and bloating (this is a period symptom for her).  She was able to obtain a pelvic ultrasound at [facility] on June 27 due to a history of a left ovarian cyst and a significant history for left lower quadrant abdominal pain.  She has some concern that endometriosis was seen on the ultrasound.  U/s at [facility] on 04/15/2021 (from report): Impression  -Question trace color Doppler signal associated with the endometrium without focal endometrial lesion identified.  -Unremarkable bilateral ovaries.   Please see below for data measurements:  LMP: 03/22/2021   Uterus: Sagittal 8.77 cm; AP 3.65 cm; Transverse 4.31 cm  Endometrium: 0.34 cm  Right ovary: Sagittal 2.20 cm; AP 1.29 cm; Transverse 2.40 cm  Left ovary: Sagittal 2.45 cm; AP 1.01 cm; Transverse 2.60 cm  Addendum HPI after calling patient to discuss further:  As regards her flank pain, she has had notable left costovertebral and flank pain, extending to pelvis. Notably, had a renal ultrasound in 07/2020 that was not optimal quality and did not render useful information.  Nothing seems to alleviate or aggravate this pain.   Past Medical History:  Diagnosis Date   Carpal tunnel syndrome    Family history of ovarian cancer    cancer genetic testing letter sent    Past Surgical History:  Procedure  Laterality Date   BREAST REDUCTION SURGERY  07/2017   LAPAROSCOPIC OVARIAN CYSTECTOMY Left 08/28/2016   Procedure: LAPAROSCOPIC OVARIAN CYSTECTOMY;  Surgeon: Will Bonnet, MD;  Location: [facility] ORS;  Service: Gynecology;  Laterality: Left;   LAPAROSCOPY N/A 08/28/2016   Procedure: LAPAROSCOPY OPERATIVE;  Surgeon: Will Bonnet, MD;  Location: [facility] ORS;  Service: Gynecology;  Laterality: N/A;   WISDOM TOOTH EXTRACTION     Gynecologic History: Patient's last menstrual period was 04/13/2021.  Obstetric History: [id number]  Family History  Problem Relation Age of Onset   Hypertension Father    Ovarian cancer Maternal Aunt 80    Social History   Socioeconomic History   Marital status: Married    Spouse name: Not on file   Number of children: Not on file   Years of education: Not on file   Highest education level: Not on file  Occupational History   Not on file  Tobacco Use   Smoking status: Never   Smokeless tobacco: Never  Vaping Use   Vaping Use: Never used  Substance and Sexual Activity   Alcohol use: Yes   Drug use: No   Sexual activity: Yes    Birth control/protection: I.U.D.  Other Topics Concern   Not on file  Social History Narrative   Not on file   Social Determinants of Health   Financial Resource Strain: Not on file  Food Insecurity: Not on file  Transportation Needs: Not on file  Physical Activity: Not on file  Stress: Not on file  Social Connections: Not on file  Intimate Partner Violence: Not on file    Allergies  Allergen Reactions   Amoxicillin Hives    Prior to Admission medications   Medication Sig Start Date End Date Taking? Authorizing Provider  magnesium 30 MG tablet Take 30 mg by mouth 2 (two) times daily.    [provider]    Review of Systems  Constitutional: Negative.   HENT: Negative.    Eyes: Negative.   Respiratory: Negative.    Cardiovascular: Negative.   Gastrointestinal: Negative.   Genitourinary: Negative.    Musculoskeletal: Negative.   Skin: Negative.   Neurological: Negative.   Psychiatric/Behavioral: Negative.      Physical Exam BP 134/85   Pulse 65   Resp 16   Ht 5' 7.5" (1.715 m)   Wt 153 lb 6.4 oz (69.6 kg)   LMP 04/13/2021   SpO2 100%   BMI 23.67 kg/m  Patient's last menstrual period was 04/13/2021. Physical Exam Constitutional:      General: She is not in acute distress.    Appearance: Normal appearance.  HENT:     Head: Normocephalic and atraumatic.  Eyes:     General: No scleral icterus.    Conjunctiva/sclera: Conjunctivae normal.  Abdominal:     Tenderness: There is no right CVA tenderness or left CVA tenderness.  Neurological:     General: No focal deficit present.     Mental Status: She is alert and oriented to person, place, and time.     Cranial Nerves: No cranial nerve deficit.  Psychiatric:        Mood and Affect: Mood normal.        Behavior: Behavior normal.        Judgment: Judgment normal.    Female chaperone present for pelvic and breast  portions of the physical exam  Assessment: 40 y.o. G9P2002 female here for  1. Lesion of endometrium   2. Menorrhagia with irregular cycle   3. Dysuria   4. Left flank pain, chronic      Plan: Problem List Items Addressed This Visit       Other   Menorrhagia with irregular cycle   Other Visit Diagnoses     Lesion of endometrium    -  Primary   Relevant Orders   MR PELVIS W WO CONTRAST   MR ABDOMEN W WO CONTRAST   Dysuria       Relevant Orders   POCT Urinalysis Dipstick (Completed)   Urine Culture (Completed)   Left flank pain, chronic          We discussed the findings from the ultrasound done at [facility].  My interpretation of the report does not indicate endometriosis.  However, there is a very vague reference to noted blood flow to an area in the endometrium without a corresponding lesion.  We discussed management strategies and evaluation strategies.  Given her irregular bleeding that is new in  onset for her after having regular periods for a long time, a work-up does make sense.  We discussed approaches ranging from a hysteroscopy to an MRI.  We discussed that an MRI may provide much more detailed imaging of the endometrium (inside lining of the uterus) with the finding noted on ultrasound of increased blood flow to 1 area.  A lesion of smaller size may be noted on MRI.  We also discussed that the  MRI may return is normal.  She would like to proceed with MRI, which I believe is reasonable given her ultrasound findings along with clinical symptoms.  At the moment, there is no sign of endometriosis.  Though, I did indicate to her that some of her pain and bloating and other symptoms may go along with endometriosis.  We reviewed that she had a laparoscopy about 5 years ago which did not have any indicators of endometriosis at that time.  She is reluctant to undergo procedures or surgeries and has not tolerated hormonal based treatments in the past.  We will follow-up based on MRI imaging.  Notably a urinalysis was done today which showed large ketones and large leukocyte esterase.  While she has no CVA tenderness and no symptoms strongly concerning for UTI, I will send her urine for culture, given her constellation of symptoms.  I called the patient after her appointment and discussed the issue further.  There is a long-standing left flank and costovertebral pain that radiates to the pelvic area.  The etiology is not clear.  This could stem from a pelvic source or an abdominal source.  There is no left CVA tenderness, which makes me less concerned about a pyelonephritis and the chronicity of the pain does not seem to suggest a nephroureterolithiasis. A possible soft-tissue lesion is more likely. The imaging modality of choice for this would be MRI.  Will attempt to order abdomino-pelvic MRI.    A total of 23 minutes were spent face-to-face with the patient as well as preparation, review, communication,  and documentation during this encounter.    Prentice Docker, MD 04/25/2021 4:56 PM

## 2021-04-26 NOTE — Addendum Note (Signed)
Addended by: Prentice Docker D on: 04/26/2021 02:54 PM   Modules accepted: Orders

## 2021-04-27 LAB — URINE CULTURE

## 2021-05-03 ENCOUNTER — Telehealth: Payer: Self-pay | Admitting: Obstetrics and Gynecology

## 2021-05-03 NOTE — Telephone Encounter (Signed)
Left generic VM 

## 2021-05-03 NOTE — Telephone Encounter (Signed)
See addendum to clinic note

## 2021-05-03 NOTE — Addendum Note (Signed)
Addended by: Prentice Docker D on: 05/03/2021 05:10 PM   Modules accepted: Orders

## 2021-05-07 ENCOUNTER — Ambulatory Visit: Admission: RE | Admit: 2021-05-07 | Payer: No Typology Code available for payment source | Source: Ambulatory Visit

## 2021-05-21 ENCOUNTER — Ambulatory Visit: Payer: No Typology Code available for payment source

## 2021-05-22 ENCOUNTER — Ambulatory Visit: Admission: RE | Admit: 2021-05-22 | Payer: PRIVATE HEALTH INSURANCE | Source: Ambulatory Visit

## 2021-05-22 ENCOUNTER — Other Ambulatory Visit: Payer: Self-pay

## 2021-05-22 ENCOUNTER — Ambulatory Visit
Admission: RE | Admit: 2021-05-22 | Discharge: 2021-05-22 | Disposition: A | Discharge status: Home / Self Care | Payer: PRIVATE HEALTH INSURANCE | Source: Ambulatory Visit | Attending: Obstetrics and Gynecology | Admitting: Obstetrics and Gynecology

## 2021-05-22 DIAGNOSIS — N859 Noninflammatory disorder of uterus, unspecified: Secondary | ICD-10-CM | POA: Insufficient documentation

## 2021-05-22 MED ORDER — GADOBUTROL 1 MMOL/ML IV SOLN
6.0000 mL | Freq: Once | INTRAVENOUS | Status: AC | PRN
  Administered 2021-05-22: 6 mL via INTRAVENOUS

## 2021-07-31 ENCOUNTER — Other Ambulatory Visit: Payer: Self-pay | Admitting: Obstetrics and Gynecology

## 2021-07-31 DIAGNOSIS — R102 Pelvic and perineal pain: Secondary | ICD-10-CM

## 2021-07-31 DIAGNOSIS — N921 Excessive and frequent menstruation with irregular cycle: Secondary | ICD-10-CM

## 2021-07-31 DIAGNOSIS — G8929 Other chronic pain: Secondary | ICD-10-CM

## 2021-08-23 ENCOUNTER — Ambulatory Visit: Admission: RE | Admit: 2021-08-23 | Payer: No Typology Code available for payment source | Source: Ambulatory Visit

## 2021-08-29 ENCOUNTER — Ambulatory Visit: Payer: Self-pay

## 2021-09-03 ENCOUNTER — Other Ambulatory Visit: Payer: Self-pay

## 2021-09-03 ENCOUNTER — Ambulatory Visit
Admission: RE | Admit: 2021-09-03 | Discharge: 2021-09-03 | Disposition: A | Discharge status: Home / Self Care | Payer: No Typology Code available for payment source | Source: Ambulatory Visit | Attending: Obstetrics and Gynecology | Admitting: Obstetrics and Gynecology

## 2021-09-03 DIAGNOSIS — R109 Unspecified abdominal pain: Secondary | ICD-10-CM | POA: Insufficient documentation

## 2021-09-03 DIAGNOSIS — R10A2 Flank pain, left side: Secondary | ICD-10-CM

## 2021-09-03 DIAGNOSIS — G8929 Other chronic pain: Secondary | ICD-10-CM | POA: Diagnosis present

## 2021-09-03 DIAGNOSIS — N921 Excessive and frequent menstruation with irregular cycle: Secondary | ICD-10-CM

## 2021-09-03 MED ORDER — IOHEXOL 300 MG/ML  SOLN
100.0000 mL | Freq: Once | INTRAMUSCULAR | Status: AC | PRN
  Administered 2021-09-03: 100 mL via INTRAVENOUS

## 2022-03-11 ENCOUNTER — Ambulatory Visit (INDEPENDENT_AMBULATORY_CARE_PROVIDER_SITE_OTHER): Payer: PRIVATE HEALTH INSURANCE

## 2022-03-11 ENCOUNTER — Ambulatory Visit
Admission: EM | Admit: 2022-03-11 | Discharge: 2022-03-11 | Disposition: A | Discharge status: Home / Self Care | Payer: PRIVATE HEALTH INSURANCE | Attending: Physician Assistant | Admitting: Physician Assistant

## 2022-03-11 DIAGNOSIS — M549 Dorsalgia, unspecified: Secondary | ICD-10-CM | POA: Diagnosis not present

## 2022-03-11 DIAGNOSIS — M546 Pain in thoracic spine: Secondary | ICD-10-CM | POA: Diagnosis not present

## 2022-03-11 DIAGNOSIS — M62830 Muscle spasm of back: Secondary | ICD-10-CM

## 2022-03-11 MED ORDER — TIZANIDINE HCL 4 MG PO TABS: 4.0000 mg | ORAL_TABLET | Freq: Two times a day (BID) | ORAL | 0 refills | Status: AC | PRN

## 2022-03-11 NOTE — ED Provider Notes (Signed)
[facility]    CSN: [id number] Arrival date & time: 03/11/22  0808      History   Chief Complaint Chief Complaint  Patient presents with   Back Pain    HPI Natalie Wiggins is a 41 y.o. female presenting today for right upper back pain with radiation to the right chest x3 days.  Patient says symptoms started after she was playing golf over the weekend.  Patient also reports carrying her son after he sustained a laceration to his forehead over the weekend.  Pain got worse yesterday and especially this morning.  Patient says she could not get out of bed until she took 3 Aleve and a muscle relaxer.  Those medications do help to improve her pain.  Reports increased pain when she moves a certain way or takes a deep breath.  She says pain is stabbing and sharp.  She does not feel short of breath but is guarding her breathing.  She denies any neck pain or radiation of pain to upper extremities, weakness,.  No mention of any palpitations.  No reported history of back problems.  No other complaints.  HPI  Past Medical History:  Diagnosis Date   Carpal tunnel syndrome    Family history of ovarian cancer    cancer genetic testing letter sent    Patient Active Problem List   Diagnosis Date Noted   Encounter for other contraceptive management 03/10/2017   Menorrhagia with irregular cycle 03/10/2017   Left ovarian cyst 08/28/2016    Past Surgical History:  Procedure Laterality Date   BREAST REDUCTION SURGERY  07/2017   LAPAROSCOPIC OVARIAN CYSTECTOMY Left 08/28/2016   Procedure: LAPAROSCOPIC OVARIAN CYSTECTOMY;  Surgeon: Wiggins Bonnet, MD;  Location: [facility] ORS;  Service: Gynecology;  Laterality: Left;   LAPAROSCOPY N/A 08/28/2016   Procedure: LAPAROSCOPY OPERATIVE;  Surgeon: Wiggins Bonnet, MD;  Location: [facility] ORS;  Service: Gynecology;  Laterality: N/A;   WISDOM TOOTH EXTRACTION      OB History     Gravida  2   Para  2   Term  2   Preterm      AB      Living   2      SAB      IAB      Ectopic      Multiple      Live Births               Home Medications    Prior to Admission medications   Medication Sig Start Date End Date Taking? Authorizing Provider  magnesium 30 MG tablet Take 30 mg by mouth 2 (two) times daily.   Yes [provider]  tiZANidine (ZANAFLEX) 4 MG tablet Take 1 tablet (4 mg total) by mouth 2 (two) times daily as needed for up to 7 days. 03/11/22 03/18/22 Yes Danton Clap, PA-C    Family History Family History  Problem Relation Age of Onset   Hypertension Father    Ovarian cancer Maternal Aunt 30    Social History Social History   Tobacco Use   Smoking status: Never   Smokeless tobacco: Never  Vaping Use   Vaping Use: Never used  Substance Use Topics   Alcohol use: Yes   Drug use: No     Allergies   Amoxicillin   Review of Systems Review of Systems  Respiratory:  Negative for shortness of breath.   Cardiovascular:  Positive for chest pain. Negative for palpitations.  Musculoskeletal:  Positive for back pain. Negative for arthralgias, joint swelling, neck pain and neck stiffness.  Neurological:  Negative for weakness and numbness.    Physical Exam Triage Vital Signs ED Triage Vitals  Enc Vitals Group     BP      Pulse      Resp      Temp      Temp src      SpO2      Weight      Height      Head Circumference      Peak Flow      Pain Score      Pain Loc      Pain Edu?      Excl. in [place]?    No data found.  Updated Vital Signs BP 103/83 (BP Location: Left Arm)   Pulse 64   Temp 98 F (36.7 C) (Oral)   Resp 18   Ht '5\' 7"'$  (1.702 m)   Wt 145 lb (65.8 kg)   LMP 02/27/2022   SpO2 100%   BMI 22.71 kg/m      Physical Exam Vitals and nursing note reviewed.  Constitutional:      General: She is not in acute distress.    Appearance: Normal appearance. She is not ill-appearing or toxic-appearing.  HENT:     Head: Normocephalic and atraumatic.  Eyes:      General: No scleral icterus.       Right eye: No discharge.        Left eye: No discharge.     Conjunctiva/sclera: Conjunctivae normal.  Cardiovascular:     Rate and Rhythm: Normal rate and regular rhythm.     Heart sounds: Normal heart sounds.  Pulmonary:     Effort: Pulmonary effort is normal. No respiratory distress.     Breath sounds: Normal breath sounds.  Chest:     Chest wall: Tenderness (mild TTP right pectoralis muscle) present.  Musculoskeletal:     Cervical back: Neck supple.     Thoracic back: Tenderness present. No swelling or bony tenderness. Decreased range of motion.       Back:     Comments: Tenderness palpation of the right parathoracic muscles.  Full range of motion of right shoulder seemingly without pain.  Skin:    General: Skin is dry.  Neurological:     General: No focal deficit present.     Mental Status: She is alert. Mental status is at baseline.     Motor: No weakness.     Gait: Gait normal.  Psychiatric:        Mood and Affect: Mood normal.        Behavior: Behavior normal.        Thought Content: Thought content normal.     UC Treatments / Results  Labs (all labs ordered are listed, but only abnormal results are displayed) Labs Reviewed - No data to display  EKG   Radiology DG Chest 2 View  Result Date: 03/11/2022 CLINICAL DATA:  Right upper thoracic pain. EXAM: CHEST - 2 VIEW COMPARISON:  10/14/2018 FINDINGS: Heart size and mediastinal contours are normal. No pleural effusion or edema identified. No airspace consolidation. Visualized osseous structures are unremarkable. IMPRESSION: No active cardiopulmonary abnormalities. Electronically Signed   By: Kerby Moors M.D.   On: 03/11/2022 08:51    Procedures Procedures (including critical care time)  Medications Ordered in UC Medications - No data to display  Initial Impression / Assessment  and Plan / UC Course  I have reviewed the triage vital signs and the nursing notes.  Pertinent  labs & imaging results that were available during my care of the patient were reviewed by me and considered in my medical decision making (see chart for details).  41 year old female presenting for right thoracic back pain and right chest pain.  Symptoms started over the weekend after playing golf and then carrying her son.  Reports symptoms got worse yesterday and especially today.  Has taken a muscle relaxer and Aleve and symptoms have improved somewhat.  Increased pain with movement and deep breathing.  Does not feel short of breath.  Vitals are normal and stable patient is overall well-appearing.  She does have tenderness palpation of the right parathoracic muscles and right pectoralis muscle.  Chest is clear to auscultation in all lung fields and she is in no acute distress.  Discussed getting a chest x-ray.  Patient is interested in having a chest x-ray performed.  X-ray obtained and independently reviewed by me.  X-ray reveals no abnormal findings. Advised patient symptoms consistent with muscle spasms and probable muscle strain.  Advised following RICE guidelines and trying heating pad as well, lidocaine patches, muscle rubs.  Sent tizanidine for her to try and continue NSAIDs as well if needed.  ED precautions reviewed and handout.   Final Clinical Impressions(s) / UC Diagnoses   Final diagnoses:  Upper back pain on right side  Muscle spasm of back     Discharge Instructions      BACK PAIN: X-ray looks good. Likely muscle spasms, strain. Stressed avoiding painful activities . RICE (REST, ICE, COMPRESSION, ELEVATION) guidelines reviewed. May alternate ice and heat. Consider use of muscle rubs, Salonpas patches, etc. Use medications as directed including muscle relaxers if prescribed. Take anti-inflammatory medications as prescribed or OTC NSAIDs/Tylenol.  F/u with PCP in 7-10 days for reexamination, and please feel free to call or return to the urgent care at any time for any questions or  concerns you may have and we Wiggins be happy to help you!   BACK PAIN RED FLAGS: If the back pain acutely worsens or there are any red flag symptoms such as numbness/tingling, leg weakness, saddle anesthesia, or loss of bowel/bladder control, go immediately to the ER. Follow up with [place] as scheduled or sooner if the pain does not begin to resolve or if it worsens before the follow up       ED Prescriptions     Medication Sig Dispense Auth. Provider   tiZANidine (ZANAFLEX) 4 MG tablet Take 1 tablet (4 mg total) by mouth 2 (two) times daily as needed for up to 7 days. 14 tablet Gretta Cool      PDMP not reviewed this encounter.   Danton Clap, PA-C 03/11/22 [phone]

## 2022-03-11 NOTE — ED Triage Notes (Signed)
Pt c/o pain in the upper right back starting on Saturday after playing golf.   Pt states that the pain has gotten worse and feels like a "rod iron stab" in her back. Pt states that it is hard to take deep breaths. Pt took a metaxalone muscle relaxer and 3 aleve.  Pt states that the pain radiates through her chest.   Pt did carry her youngest son after golfing.

## 2022-03-11 NOTE — Discharge Instructions (Addendum)
BACK PAIN: X-ray looks good. Likely muscle spasms, strain. Stressed avoiding painful activities . RICE (REST, ICE, COMPRESSION, ELEVATION) guidelines reviewed. May alternate ice and heat. Consider use of muscle rubs, Salonpas patches, etc. Use medications as directed including muscle relaxers if prescribed. Take anti-inflammatory medications as prescribed or OTC NSAIDs/Tylenol.  F/u with PCP in 7-10 days for reexamination, and please feel free to call or return to the urgent care at any time for any questions or concerns you may have and we will be happy to help you!   BACK PAIN RED FLAGS: If the back pain acutely worsens or there are any red flag symptoms such as numbness/tingling, leg weakness, saddle anesthesia, or loss of bowel/bladder control, go immediately to the ER. Follow up with [place] as scheduled or sooner if the pain does not begin to resolve or if it worsens before the follow up

## 2022-03-13 NOTE — H&P (Signed)
Preoperative History and Physical  Natalie Wiggins is a 41 y.o. [id number] here for surgical management of Menorrhagia with irregular cycle.   No significant preoperative concerns.  History of Present Illness: 41 y.o. G92P2002 female who presents for abnormal periods    Over the past year she has had weight gain. Her cloths are fitting well.She has had very heavy periods and has had to use the big pads.  Her periods are every 14-18 days (without a real pattern).  Her periods last 7-9 days. She had tried lots of lifestyle modifications.  She has a history of abnormal uterine bleeding and has had multiple imaging modalities showing no obvious etiology (MRI, Ultrasound, CT).  She had a left sided ovarian cyst rupture last week and this was extremely painful for her.  She still feels bloated with pressure and pain on her left side.  She has a long history of pain and abnormal uterine bleeding and we have had multiple discussions in the past on treatment options.  In the past she has elected not to use any hormonal treatment due to side effects.  She has tried an exercise regimen to lose weight. Her clothes don't fit her as well as they used to fit. She also has pain on her left side. This is worse when she drives.   She also has swelling in her hands.    Last pap smear: 12/30/2017, NILM, HPV negative  Proposed surgery: Hysteroscopy, dilation and curettage, endometrial ablation  Past Medical History:  Diagnosis Date   Carpal tunnel syndrome    Family history of ovarian cancer    cancer genetic testing letter sent   Past Surgical History:  Procedure Laterality Date   BREAST REDUCTION SURGERY  07/2017   LAPAROSCOPIC OVARIAN CYSTECTOMY Left 08/28/2016   Procedure: LAPAROSCOPIC OVARIAN CYSTECTOMY;  Surgeon: Will Bonnet, MD;  Location: [facility] ORS;  Service: Gynecology;  Laterality: Left;   LAPAROSCOPY N/A 08/28/2016   Procedure: LAPAROSCOPY OPERATIVE;  Surgeon: Will Bonnet, MD;  Location: [facility] ORS;   Service: Gynecology;  Laterality: N/A;   WISDOM TOOTH EXTRACTION     OB History  Gravida Para Term Preterm AB Living  '2 2 2     2  '$ SAB IAB Ectopic Multiple Live Births               # Outcome Date GA Lbr Len/2nd Weight Sex Delivery Anes PTL Lv  2 Term           1 Term           Patient denies any other pertinent gynecologic issues.   No current facility-administered medications on file prior to encounter.   Current Outpatient Medications on File Prior to Encounter  Medication Sig Dispense Refill   magnesium 30 MG tablet Take 30 mg by mouth 2 (two) times daily.     tiZANidine (ZANAFLEX) 4 MG tablet Take 1 tablet (4 mg total) by mouth 2 (two) times daily as needed for up to 7 days. 14 tablet 0   Allergies  Allergen Reactions   Amoxicillin Hives    Social History:   reports that she has never smoked. She has never used smokeless tobacco. She reports current alcohol use. She reports that she does not use drugs.  Family History  Problem Relation Age of Onset   Hypertension Father    Ovarian cancer Maternal Aunt 30    Review of Systems: Noncontributory  PHYSICAL EXAM: Last menstrual period 02/27/2022. CONSTITUTIONAL: Well-developed, well-nourished female in no  acute distress.  HENT:  Normocephalic, atraumatic, External right and left ear normal. Oropharynx is clear and moist EYES: Conjunctivae and EOM are normal. Pupils are equal, round, and reactive to light. No scleral icterus.  NECK: Normal range of motion, supple, no masses SKIN: Skin is warm and dry. No rash noted. Not diaphoretic. No erythema. No pallor. [place]: Alert and oriented to person, place, and time. Normal reflexes, muscle tone coordination. No cranial nerve deficit noted. PSYCHIATRIC: Normal mood and affect. Normal behavior. Normal judgment and thought content. CARDIOVASCULAR: Normal heart rate noted, regular rhythm RESPIRATORY: Effort and breath sounds normal, no problems with respiration noted ABDOMEN:  Soft, nontender, nondistended. PELVIC: Deferred MUSCULOSKELETAL: Normal range of motion. No edema and no tenderness. 2+ distal pulses.  Labs: No results found for this or any previous visit (from the past 336 hour(s)).  Imaging Studies: DG Chest 2 View  Result Date: 03/11/2022 CLINICAL DATA:  Right upper thoracic pain. EXAM: CHEST - 2 VIEW COMPARISON:  10/14/2018 FINDINGS: Heart size and mediastinal contours are normal. No pleural effusion or edema identified. No airspace consolidation. Visualized osseous structures are unremarkable. IMPRESSION: No active cardiopulmonary abnormalities. Electronically Signed   By: Kerby Moors M.D.   On: 03/11/2022 08:51    Assessment: Menorrhagia with irregular cycle  Plan: Patient will undergo surgical management with the above-noted surgery.   The risks of surgery were discussed in detail with the patient including but not limited to: bleeding which may require transfusion or reoperation; infection which may require antibiotics; injury to surrounding organs which may involve bowel, bladder, ureters ; need for additional procedures including laparoscopy or laparotomy; thromboembolic phenomenon, surgical site problems and other postoperative/anesthesia complications. Likelihood of success in alleviating the patient's condition was discussed. Routine postoperative instructions will be reviewed with the patient and her family in detail after surgery.  The patient concurred with the proposed plan, giving informed written consent for the surgery.  Preoperative prophylactic antibiotics, as indicated, and SCDs ordered on call to the OR.    Prentice Docker, MD 03/13/2022 7:06 PM

## 2022-03-14 ENCOUNTER — Other Ambulatory Visit: Payer: Self-pay | Admitting: Obstetrics and Gynecology

## 2022-03-25 ENCOUNTER — Encounter: Payer: Self-pay | Admitting: Urgent Care

## 2022-03-25 ENCOUNTER — Encounter
Admission: RE | Admit: 2022-03-25 | Discharge: 2022-03-25 | Disposition: A | Discharge status: Home / Self Care | Payer: PRIVATE HEALTH INSURANCE | Source: Ambulatory Visit | Attending: Obstetrics and Gynecology | Admitting: Obstetrics and Gynecology

## 2022-03-25 VITALS — Ht 67.0 in | Wt 153.0 lb

## 2022-03-25 DIAGNOSIS — N921 Excessive and frequent menstruation with irregular cycle: Secondary | ICD-10-CM | POA: Diagnosis not present

## 2022-03-25 DIAGNOSIS — Z01812 Encounter for preprocedural laboratory examination: Secondary | ICD-10-CM | POA: Insufficient documentation

## 2022-03-25 HISTORY — DX: Personal history of urinary calculi: Z87.442

## 2022-03-25 LAB — TYPE AND SCREEN
ABO/RH(D): O NEG
Antibody Screen: NEGATIVE

## 2022-03-25 LAB — CBC
HCT: 37.4 % (ref 36.0–46.0)
Hemoglobin: 12.6 g/dL (ref 12.0–15.0)
MCH: 28.7 pg (ref 26.0–34.0)
MCHC: 33.7 g/dL (ref 30.0–36.0)
MCV: 85.2 fL (ref 80.0–100.0)
Platelets: 230 10*3/uL (ref 150–400)
RBC: 4.39 MIL/uL (ref 3.87–5.11)
RDW: 12.7 % (ref 11.5–15.5)
WBC: 5.8 10*3/uL (ref 4.0–10.5)
nRBC: 0 % (ref 0.0–0.2)

## 2022-03-25 NOTE — Patient Instructions (Addendum)
Your procedure is scheduled on: Thursday, June 15 Report to the Registration Desk on the 1st floor of the [facility]. To find out your arrival time, please call [phone] between 1PM - 3PM on: Wednesday, June 14 If your arrival time is 6:00 am, do not arrive prior to that time as the [facility] entrance doors do not open until 6:00 am.  REMEMBER: Instructions that are not followed completely may result in serious medical risk, up to and including death; or upon the discretion of your surgeon and anesthesiologist your surgery may need to be rescheduled.  Do not eat food after midnight the night before surgery.  No gum chewing, lozengers or hard candies.  You may however, drink CLEAR liquids up to 2 hours before you are scheduled to arrive for your surgery. Do not drink anything within 2 hours of your scheduled arrival time.  Clear liquids include: - water  - apple juice without pulp - gatorade (not RED colors) - black coffee or tea (Do NOT add milk or creamers to the coffee or tea) Do NOT drink anything that is not on this list.  In addition, your doctor has ordered for you to drink the provided  Ensure Pre-Surgery Clear Carbohydrate Drink  Drinking this carbohydrate drink up to two hours before surgery helps to reduce insulin resistance and improve patient outcomes. Please complete drinking 2 hours prior to scheduled arrival time.  DO NOT TAKE ANY MEDICATIONS THE MORNING OF SURGERY   One week prior to surgery: starting June 8 Stop Anti-inflammatories (NSAIDS) such as Advil, Aleve, Ibuprofen, Motrin, Naproxen, Naprosyn and Aspirin based products such as Excedrin, Goodys Powder, BC Powder. Stop ANY OVER THE COUNTER supplements until after surgery. You may however, continue to take Tylenol if needed for pain up until the day of surgery.  No Alcohol for 24 hours before or after surgery.  No Smoking including e-cigarettes for 24 hours prior to surgery.  No chewable tobacco  products for at least 6 hours prior to surgery.  No nicotine patches on the day of surgery.  Do not use any "recreational" drugs for at least a week prior to your surgery.  Please be advised that the combination of cocaine and anesthesia may have negative outcomes, up to and including death. If you test positive for cocaine, your surgery will be cancelled.  On the morning of surgery brush your teeth with toothpaste and water, you may rinse your mouth with mouthwash if you wish. Do not swallow any toothpaste or mouthwash.  Do not wear jewelry, make-up, hairpins, clips or nail polish.  Do not wear lotions, powders, or perfumes.   Do not shave body from the neck down 48 hours prior to surgery just in case you cut yourself which could leave a site for infection.   Contact lenses, hearing aids and dentures may not be worn into surgery.  Do not bring valuables to the hospital. [facility] is not responsible for any missing/lost belongings or valuables.   Notify your doctor if there is any change in your medical condition (cold, fever, infection).  Wear comfortable clothing (specific to your surgery type) to the hospital.  After surgery, you can help prevent lung complications by doing breathing exercises.  Take deep breaths and cough every 1-2 hours. Your doctor may order a device called an Incentive Spirometer to help you take deep breaths.  If you are being discharged the day of surgery, you will not be allowed to drive home. You will need  a responsible adult (18 years or older) to drive you home and stay with you that night.   If you are taking public transportation, you will need to have a responsible adult (18 years or older) with you. Please confirm with your physician that it is acceptable to use public transportation.   Please call the [facility]. at [phone] if you have any questions about these instructions.  Surgery Visitation Policy:  Patients  undergoing a surgery or procedure may have two family members or support persons with them as long as the person is not COVID-19 positive or experiencing its symptoms.

## 2022-04-03 ENCOUNTER — Encounter
Admission: RE | Disposition: A | Discharge status: Home / Self Care | Payer: Self-pay | Source: Home / Self Care | Attending: Obstetrics and Gynecology

## 2022-04-03 ENCOUNTER — Ambulatory Visit: Payer: PRIVATE HEALTH INSURANCE | Admitting: Urgent Care

## 2022-04-03 ENCOUNTER — Ambulatory Visit
Admission: RE | Admit: 2022-04-03 | Discharge: 2022-04-03 | Disposition: A | Discharge status: Home / Self Care | Payer: PRIVATE HEALTH INSURANCE | Attending: Obstetrics and Gynecology | Admitting: Obstetrics and Gynecology

## 2022-04-03 ENCOUNTER — Encounter: Payer: Self-pay | Admitting: Obstetrics and Gynecology

## 2022-04-03 ENCOUNTER — Other Ambulatory Visit: Payer: Self-pay

## 2022-04-03 DIAGNOSIS — N83202 Unspecified ovarian cyst, left side: Secondary | ICD-10-CM | POA: Diagnosis not present

## 2022-04-03 DIAGNOSIS — N921 Excessive and frequent menstruation with irregular cycle: Secondary | ICD-10-CM | POA: Insufficient documentation

## 2022-04-03 DIAGNOSIS — Z01812 Encounter for preprocedural laboratory examination: Secondary | ICD-10-CM

## 2022-04-03 HISTORY — PX: DILATATION AND CURETTAGE/HYSTEROSCOPY WITH MINERVA: SHX6851

## 2022-04-03 LAB — POCT PREGNANCY, URINE: Preg Test, Ur: NEGATIVE

## 2022-04-03 SURGERY — DILATATION AND CURETTAGE/HYSTEROSCOPY WITH MINERVA
Anesthesia: General

## 2022-04-03 MED ORDER — KETOROLAC TROMETHAMINE 30 MG/ML IJ SOLN
INTRAMUSCULAR | Status: AC
  Filled 2022-04-03: qty 1

## 2022-04-03 MED ORDER — CHLORHEXIDINE GLUCONATE 0.12 % MT SOLN: 15.0000 mL | Freq: Once | OROMUCOSAL | Status: AC

## 2022-04-03 MED ORDER — DEXAMETHASONE SODIUM PHOSPHATE 10 MG/ML IJ SOLN
INTRAMUSCULAR | Status: DC | PRN
  Administered 2022-04-03: 10 mg via INTRAVENOUS

## 2022-04-03 MED ORDER — ACETAMINOPHEN 10 MG/ML IV SOLN: 1000.0000 mg | Freq: Once | INTRAVENOUS | Status: DC | PRN

## 2022-04-03 MED ORDER — GABAPENTIN 300 MG PO CAPS
ORAL_CAPSULE | ORAL | Status: DC
Start: 2022-04-03 — End: 2022-04-03
  Filled 2022-04-03: qty 1

## 2022-04-03 MED ORDER — MIDAZOLAM HCL 2 MG/2ML IJ SOLN
INTRAMUSCULAR | Status: AC
  Filled 2022-04-03: qty 2

## 2022-04-03 MED ORDER — LACTATED RINGERS IV SOLN: INTRAVENOUS | Status: DC

## 2022-04-03 MED ORDER — ONDANSETRON HCL 4 MG/2ML IJ SOLN
INTRAMUSCULAR | Status: DC | PRN
  Administered 2022-04-03: 4 mg via INTRAVENOUS

## 2022-04-03 MED ORDER — FENTANYL CITRATE (PF) 100 MCG/2ML IJ SOLN
INTRAMUSCULAR | Status: AC
  Administered 2022-04-03: 50 ug via INTRAVENOUS
  Filled 2022-04-03: qty 2

## 2022-04-03 MED ORDER — FAMOTIDINE 20 MG PO TABS
ORAL_TABLET | ORAL | Status: AC
  Administered 2022-04-03: 20 mg via ORAL
  Filled 2022-04-03: qty 1

## 2022-04-03 MED ORDER — SILVER NITRATE-POT NITRATE 75-25 % EX MISC
CUTANEOUS | Status: DC | PRN
  Administered 2022-04-03: 2

## 2022-04-03 MED ORDER — OXYCODONE HCL 5 MG PO TABS
ORAL_TABLET | ORAL | Status: AC
  Administered 2022-04-03: 5 mg via ORAL
  Filled 2022-04-03: qty 1

## 2022-04-03 MED ORDER — FENTANYL CITRATE (PF) 100 MCG/2ML IJ SOLN
INTRAMUSCULAR | Status: AC
  Administered 2022-04-03: 25 ug via INTRAVENOUS
  Filled 2022-04-03: qty 2

## 2022-04-03 MED ORDER — IBUPROFEN 600 MG PO TABS: 600.0000 mg | ORAL_TABLET | Freq: Four times a day (QID) | ORAL | 0 refills | Status: AC

## 2022-04-03 MED ORDER — EPHEDRINE SULFATE (PRESSORS) 50 MG/ML IJ SOLN
INTRAMUSCULAR | Status: DC | PRN
  Administered 2022-04-03 (×2): 5 mg via INTRAVENOUS

## 2022-04-03 MED ORDER — OXYCODONE HCL 5 MG/5ML PO SOLN: 5.0000 mg | Freq: Once | ORAL | Status: AC | PRN

## 2022-04-03 MED ORDER — ORAL CARE MOUTH RINSE: 15.0000 mL | Freq: Once | OROMUCOSAL | Status: AC

## 2022-04-03 MED ORDER — HYDROCODONE-ACETAMINOPHEN 5-325 MG PO TABS: 1.0000 | ORAL_TABLET | Freq: Three times a day (TID) | ORAL | 0 refills | Status: AC | PRN

## 2022-04-03 MED ORDER — 0.9 % SODIUM CHLORIDE (POUR BTL) OPTIME
TOPICAL | Status: DC | PRN
  Administered 2022-04-03: 500 mL

## 2022-04-03 MED ORDER — ACETAMINOPHEN 500 MG PO TABS
ORAL_TABLET | ORAL | Status: AC
  Filled 2022-04-03: qty 2

## 2022-04-03 MED ORDER — LIDOCAINE HCL (CARDIAC) PF 100 MG/5ML IV SOSY
PREFILLED_SYRINGE | INTRAVENOUS | Status: DC | PRN
  Administered 2022-04-03: 100 mg via INTRAVENOUS

## 2022-04-03 MED ORDER — FAMOTIDINE 20 MG PO TABS: 20.0000 mg | ORAL_TABLET | Freq: Once | ORAL | Status: AC

## 2022-04-03 MED ORDER — ONDANSETRON 8 MG PO TBDP: 8.0000 mg | ORAL_TABLET | Freq: Three times a day (TID) | ORAL | 1 refills | Status: AC | PRN

## 2022-04-03 MED ORDER — FENTANYL CITRATE (PF) 100 MCG/2ML IJ SOLN
25.0000 ug | INTRAMUSCULAR | Status: AC | PRN
  Administered 2022-04-03 (×3): 25 ug via INTRAVENOUS
  Administered 2022-04-03: 50 ug via INTRAVENOUS

## 2022-04-03 MED ORDER — OXYCODONE HCL 5 MG PO TABS: 5.0000 mg | ORAL_TABLET | Freq: Once | ORAL | Status: AC | PRN

## 2022-04-03 MED ORDER — DEXAMETHASONE SODIUM PHOSPHATE 10 MG/ML IJ SOLN
INTRAMUSCULAR | Status: AC
  Filled 2022-04-03: qty 1

## 2022-04-03 MED ORDER — ACETAMINOPHEN 500 MG PO TABS
1000.0000 mg | ORAL_TABLET | Freq: Once | ORAL | Status: AC
  Administered 2022-04-03: 1000 mg via ORAL

## 2022-04-03 MED ORDER — DROPERIDOL 2.5 MG/ML IJ SOLN: 0.6250 mg | Freq: Once | INTRAMUSCULAR | Status: DC | PRN

## 2022-04-03 MED ORDER — FENTANYL CITRATE (PF) 100 MCG/2ML IJ SOLN
INTRAMUSCULAR | Status: AC
  Filled 2022-04-03: qty 2

## 2022-04-03 MED ORDER — GABAPENTIN 300 MG PO CAPS
300.0000 mg | ORAL_CAPSULE | Freq: Once | ORAL | Status: AC
  Administered 2022-04-03: 300 mg via ORAL

## 2022-04-03 MED ORDER — MIDAZOLAM HCL 2 MG/2ML IJ SOLN
INTRAMUSCULAR | Status: DC | PRN
  Administered 2022-04-03: 2 mg via INTRAVENOUS

## 2022-04-03 MED ORDER — FENTANYL CITRATE (PF) 100 MCG/2ML IJ SOLN
INTRAMUSCULAR | Status: DC | PRN
  Administered 2022-04-03: 50 ug via INTRAVENOUS

## 2022-04-03 MED ORDER — CHLORHEXIDINE GLUCONATE 0.12 % MT SOLN
OROMUCOSAL | Status: AC
  Administered 2022-04-03: 15 mL via OROMUCOSAL
  Filled 2022-04-03: qty 15

## 2022-04-03 MED ORDER — PROPOFOL 10 MG/ML IV BOLUS
INTRAVENOUS | Status: DC | PRN
  Administered 2022-04-03: 150 mg via INTRAVENOUS
  Administered 2022-04-03: 50 mg via INTRAVENOUS

## 2022-04-03 MED ORDER — ONDANSETRON HCL 4 MG/2ML IJ SOLN
INTRAMUSCULAR | Status: AC
  Filled 2022-04-03: qty 2

## 2022-04-03 MED ORDER — PROMETHAZINE HCL 25 MG/ML IJ SOLN: 6.2500 mg | INTRAMUSCULAR | Status: DC | PRN

## 2022-04-03 SURGICAL SUPPLY — 30 items
BACTOSHIELD CHG 4% 4OZ (MISCELLANEOUS) ×1
BAG DRN RND TRDRP ANRFLXCHMBR (UROLOGICAL SUPPLIES)
BAG URINE DRAIN 2000ML AR STRL (UROLOGICAL SUPPLIES) IMPLANT
CATH FOLEY 2WAY  5CC 16FR (CATHETERS)
CATH FOLEY 2WAY 5CC 16FR (CATHETERS)
CATH URTH 16FR FL 2W BLN LF (CATHETERS) IMPLANT
DEVICE MYOSURE LITE (MISCELLANEOUS) ×1 IMPLANT
DEVICE MYOSURE REACH (MISCELLANEOUS) ×1 IMPLANT
DRSG TELFA 3X8 NADH (GAUZE/BANDAGES/DRESSINGS) IMPLANT
ELECT REM PT RETURN 9FT ADLT (ELECTROSURGICAL) ×2
ELECTRODE REM PT RTRN 9FT ADLT (ELECTROSURGICAL) ×1 IMPLANT
GLOVE BIO SURGEON STRL SZ7 (GLOVE) ×2 IMPLANT
GLOVE SURG UNDER POLY LF SZ7.5 (GLOVE) ×2 IMPLANT
GOWN STRL REUS W/ TWL LRG LVL3 (GOWN DISPOSABLE) ×2 IMPLANT
GOWN STRL REUS W/TWL LRG LVL3 (GOWN DISPOSABLE) ×4
HANDPIECE ABLA MINERVA ENDO (MISCELLANEOUS) ×1 IMPLANT
IV NS IRRIG 3000ML ARTHROMATIC (IV SOLUTION) ×2 IMPLANT
KIT PROCEDURE FLUENT (KITS) ×1 IMPLANT
KIT TURNOVER CYSTO (KITS) ×2 IMPLANT
MANIFOLD NEPTUNE II (INSTRUMENTS) ×2 IMPLANT
PACK DNC HYST (MISCELLANEOUS) ×2 IMPLANT
PAD DRESSING TELFA 3X8 NADH (GAUZE/BANDAGES/DRESSINGS) IMPLANT
PAD OB MATERNITY 4.3X12.25 (PERSONAL CARE ITEMS) ×2 IMPLANT
PAD PREP 24X41 OB/GYN DISP (PERSONAL CARE ITEMS) ×2 IMPLANT
SCRUB CHG 4% DYNA-HEX 4OZ (MISCELLANEOUS) ×1 IMPLANT
SEAL ROD LENS SCOPE MYOSURE (ABLATOR) ×2 IMPLANT
SET CYSTO W/LG BORE CLAMP LF (SET/KITS/TRAYS/PACK) IMPLANT
SURGILUBE 2OZ TUBE FLIPTOP (MISCELLANEOUS) ×2 IMPLANT
TUBING CONNECTING 10 (TUBING) ×2 IMPLANT
WATER STERILE IRR 500ML POUR (IV SOLUTION) ×2 IMPLANT

## 2022-04-03 NOTE — Op Note (Signed)
Operative Note    Name: Natalie Wiggins  Date of Birth: 05-06-1981  Date of Service: 04/03/2022  MRN: [id number]   PRE-OP DIAGNOSIS: Menorrhagia with irregular cycle [N92.1]  POST-OP DIAGNOSIS: Menorrhagia with irregular cycle [N92.1]  SURGEON: Will Bonnet, MD  PROCEDURE:  1) Hysteroscopy, dilation and curettage 2) Endometrial ablation  ANESTHESIA: General   ESTIMATED BLOOD LOSS: 10 mL   IV Fluid: 800 mL crystalloid  SPECIMENS: endometrial curettings  FLUID DEFICIT: min   COMPLICATIONS: none   DISPOSITION: PACU - hemodynamically stable.   CONDITION: stable   FINDINGS: Exam under anesthesia revealed small, mobile uterus with no masses and bilateral adnexa without masses or fullness. Hysteroscopy revealed grossly normal appearing uterine cavity with bilateral tubal ostia and normal appearing endocervical canal. Findings after ablation revealed globally ablated endometrium.   PROCEDURE IN DETAIL: After informed consent was obtained, the patient was taken to the operating room where anesthesia was obtained without difficulty. The patient was positioned in the dorsal, supine lithotomy position in [place]. The patient's bladder was catheterized with an in and out foley catheter. The patient was examined under anesthesia, with the above noted findings. The bivalved speculum was placed inside the patient's vagina, and the the anterior lip of the cervix was grasped with the tenaculum. The uterine cavity was sounded to 9 cm, and then the cervix was progressively dilated to 73m using Hegar dilators. The hysteroscope was introduced, with LR fluid used to distend the intrauterine cavity, with the above noted findings were appreciated.    The hystersocope was removed and the uterine cavity was curetted until a gritty texture was noted, yielding adequate endometrial curettings. The cervix was measured to be 4 cm, leaving a cavity depth of 5.0 cm.    The Minerva device was then placed  without difficulty. The balloon was insufflated and the device was deployed with the cavity, width demonstrating sufficient size as demonstrated by the black line appearing within the clear window. The seal test was successful.  After confirmation, the procedure performed. Length of procedure was the standard 120 seconds. After successful ablation the balloon was deflated and the device was then removed and repeat hysteroscopy revealed an appropriate ablation of the lining of the uterus and no perforation or injury. Hysteroscope was removed with minimal discrepancy of fluid.   Tenaculum was removed with excellent hemostasis noted after application of silver nitrate to the tenaculum entry sites. After verifying no sponges or instruments left in the vagina, the speculum was removed.  She was then taken out of dorsal lithotomy. Hemostasis noted.   The patient tolerated the procedure well. Sponge, lap and needle counts were correct x2. The patient was taken to recovery room in excellent condition.  SPrentice Docker MD 04/03/2022 4:56 PM

## 2022-04-03 NOTE — Discharge Instructions (Signed)

## 2022-04-03 NOTE — Interval H&P Note (Signed)
History and Physical Interval Note:   04/03/2022 4:06 PM    Natalie Wiggins   has presented today for surgery, with the diagnosis of menorrhagia with irregular cycle.  The various methods of treatment have been discussed with the patient and family. After consideration of risks, benefits and other options for treatment, the patient has consented to  Procedure(s): Hysteroscopy, dilation and curettage, endometrial ablation as a surgical intervention.  The patient's history has been reviewed, patient examined, no change in status, stable for surgery.  I have reviewed the patient's chart and labs.  Questions were answered to the patient's satisfaction.  She is feeling much better today. Consents reviewed and signed. She wishes to proceed.   Prentice Docker, MD, [facility] 04/03/2022 4:07 PM

## 2022-04-03 NOTE — Anesthesia Procedure Notes (Signed)
Procedure Name: LMA Insertion Date/Time: 04/03/2022 4:16 PM  Performed by: Aline Brochure, CRNAPre-anesthesia Checklist: Patient identified, Patient being monitored, Timeout performed, Emergency Drugs available and Suction available Patient Re-evaluated:Patient Re-evaluated prior to induction Oxygen Delivery Method: Circle system utilized Preoxygenation: Pre-oxygenation with 100% oxygen Induction Type: IV induction Ventilation: Mask ventilation without difficulty LMA: LMA inserted LMA Size: 4.0 Tube type: Oral Number of attempts: 1 Placement Confirmation: positive ETCO2 and breath sounds checked- equal and bilateral Tube secured with: Tape Dental Injury: Teeth and Oropharynx as per pre-operative assessment

## 2022-04-03 NOTE — Transfer of Care (Signed)
Immediate Anesthesia Transfer of Care Note  Patient: Natalie Wiggins  Procedure(s) Performed: DILATATION AND CURETTAGE/HYSTEROSCOPY WITH MINERVA  Patient Location: PACU  Anesthesia Type:General  Level of Consciousness: awake  Airway & Oxygen Therapy: Patient Spontanous Breathing and Patient connected to face mask oxygen  Post-op Assessment: Report given to RN and Post -op Vital signs reviewed and stable  Post vital signs: Reviewed and stable  Last Vitals:  Vitals Value Taken Time  BP 102/67 04/03/22 1700  Temp    Pulse 91 04/03/22 1702  Resp 19 04/03/22 1702  SpO2 97 % 04/03/22 1702  Vitals shown include unvalidated device data.  Last Pain:  Vitals:   04/03/22 1310  TempSrc: Temporal  PainSc: 0-No pain      Patients Stated Pain Goal: 0 (54/65/03 5465)  Complications: No notable events documented.

## 2022-04-03 NOTE — Anesthesia Postprocedure Evaluation (Signed)
Anesthesia Post Note  Patient: Natalie Wiggins  Procedure(s) Performed: DILATATION AND CURETTAGE/HYSTEROSCOPY WITH MINERVA  Patient location during evaluation: PACU Anesthesia Type: General Level of consciousness: awake and alert Pain management: pain level controlled Vital Signs Assessment: post-procedure vital signs reviewed and stable Respiratory status: spontaneous breathing, nonlabored ventilation and respiratory function stable Cardiovascular status: blood pressure returned to baseline and stable Postop Assessment: no apparent nausea or vomiting Anesthetic complications: no   No notable events documented.   Last Vitals:  Vitals:   04/03/22 1800 04/03/22 1810  BP:  110/81  Pulse: 60 64  Resp: 11 18  Temp: 36.5 C   SpO2: 100% 100%    Last Pain:  Vitals:   04/03/22 1810  TempSrc:   PainSc: 2                  [place] Ouch

## 2022-04-03 NOTE — Anesthesia Preprocedure Evaluation (Addendum)
Anesthesia Evaluation  Patient identified by MRN, date of birth, ID band Patient awake    Reviewed: Allergy & Precautions, NPO status , Patient's Chart, lab work & pertinent test results  History of Anesthesia Complications (+) history of anesthetic complications (Reports prolonged wake-up from anesthesia)  Airway Mallampati: III  TM Distance: >3 FB Neck ROM: full    Dental no notable dental hx.    Pulmonary neg pulmonary ROS,    Pulmonary exam normal        Cardiovascular negative cardio ROS Normal cardiovascular exam     Neuro/Psych negative neurological ROS  negative psych ROS   GI/Hepatic negative GI ROS, Neg liver ROS,   Endo/Other  negative endocrine ROS  Renal/GU      Musculoskeletal   Abdominal Normal abdominal exam  (+)   Peds  Hematology negative hematology ROS (+)   Anesthesia Other Findings Past Medical History: No date: Carpal tunnel syndrome No date: Family history of ovarian cancer     Comment:  cancer genetic testing letter sent No date: History of kidney stones 08/2016: Left ovarian cyst  Past Surgical History: 07/2017: BREAST REDUCTION SURGERY 08/28/2016: LAPAROSCOPIC OVARIAN CYSTECTOMY; Left     Comment:  Procedure: LAPAROSCOPIC OVARIAN CYSTECTOMY;  Surgeon:               Will Bonnet, MD;  Location: [facility] ORS;  Service:               Gynecology;  Laterality: Left; 08/28/2016: LAPAROSCOPY; N/A     Comment:  Procedure: LAPAROSCOPY OPERATIVE;  Surgeon: Will Bonnet, MD;  Location: [facility] ORS;  Service: Gynecology;                Laterality: N/A; No date: WISDOM TOOTH EXTRACTION     Reproductive/Obstetrics menorrhagia with irregular cycle                            Anesthesia Physical Anesthesia Plan  ASA: 2  Anesthesia Plan: General LMA   Post-op Pain Management: Tylenol PO (pre-op)*, Celebrex PO (pre-op)* and Gabapentin PO (pre-op)*    Induction: Intravenous  PONV Risk Score and Plan: 4 or greater and Dexamethasone, Ondansetron, Midazolam and Promethazine  Airway Management Planned: LMA  Additional Equipment:   Intra-op Plan:   Post-operative Plan: Extubation in OR  Informed Consent: I have reviewed the patients History and Physical, chart, labs and discussed the procedure including the risks, benefits and alternatives for the proposed anesthesia with the patient or authorized representative who has indicated his/her understanding and acceptance.     Dental Advisory Given  Plan Discussed with: Anesthesiologist, CRNA and Surgeon  Anesthesia Plan Comments:        Anesthesia Quick Evaluation

## 2022-04-04 ENCOUNTER — Encounter: Payer: Self-pay | Admitting: Obstetrics and Gynecology

## 2022-04-07 LAB — SURGICAL PATHOLOGY

## 2022-05-05 ENCOUNTER — Other Ambulatory Visit: Payer: Self-pay | Admitting: Family Medicine

## 2022-05-05 DIAGNOSIS — Z1231 Encounter for screening mammogram for malignant neoplasm of breast: Secondary | ICD-10-CM

## 2022-05-28 ENCOUNTER — Other Ambulatory Visit: Payer: Self-pay | Admitting: Family Medicine

## 2022-05-28 ENCOUNTER — Ambulatory Visit
Admission: RE | Admit: 2022-05-28 | Discharge: 2022-05-28 | Disposition: A | Discharge status: Home / Self Care | Payer: PRIVATE HEALTH INSURANCE | Source: Ambulatory Visit | Attending: Family Medicine | Admitting: Family Medicine

## 2022-05-28 DIAGNOSIS — Z1231 Encounter for screening mammogram for malignant neoplasm of breast: Secondary | ICD-10-CM | POA: Insufficient documentation

## 2022-06-04 ENCOUNTER — Other Ambulatory Visit: Payer: Self-pay | Admitting: Family Medicine

## 2022-06-04 DIAGNOSIS — N63 Unspecified lump in unspecified breast: Secondary | ICD-10-CM

## 2022-06-04 DIAGNOSIS — R928 Other abnormal and inconclusive findings on diagnostic imaging of breast: Secondary | ICD-10-CM

## 2022-06-11 ENCOUNTER — Ambulatory Visit
Admission: RE | Admit: 2022-06-11 | Discharge: 2022-06-11 | Disposition: A | Discharge status: Home / Self Care | Payer: PRIVATE HEALTH INSURANCE | Source: Ambulatory Visit | Attending: Family Medicine | Admitting: Family Medicine

## 2022-06-11 DIAGNOSIS — R928 Other abnormal and inconclusive findings on diagnostic imaging of breast: Secondary | ICD-10-CM

## 2022-06-11 DIAGNOSIS — N63 Unspecified lump in unspecified breast: Secondary | ICD-10-CM | POA: Insufficient documentation

## 2022-06-18 IMAGING — CT CT ABD-PELV W/ CM
2 of 4 series · 16 of 46 positions shown, 18 images · IV contrast (omnipaque)
Comparison: Pelvic MRI on 05/22/2021

CLINICAL DATA: Recurrent severe left flank and left pelvic pain.

EXAM:
CT ABDOMEN AND PELVIS WITH CONTRAST
TECHNIQUE: Multidetector CT imaging of the abdomen and pelvis was performed
using the standard protocol following bolus administration of
intravenous contrast.
CONTRAST:  100mL OMNIPAQUE IOHEXOL 300 MG/ML  SOLN

[Series 2: abd pelvis 5.00 · axial · 0.67mm/px · z∈[-1550,-1125]mm · 13 of 93 slices shown, 15 images]
[im 4/93  soft-tissue]
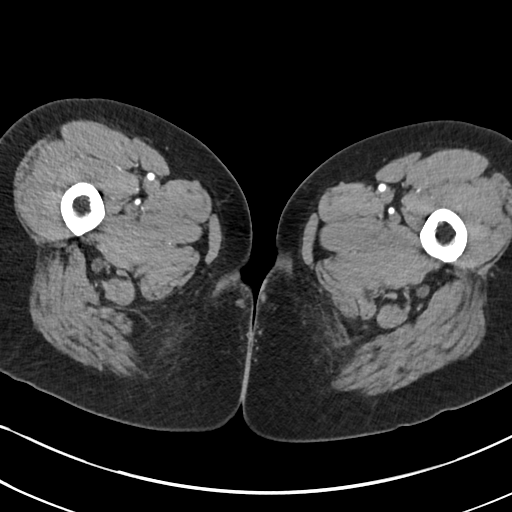
[im 4/93  bone]
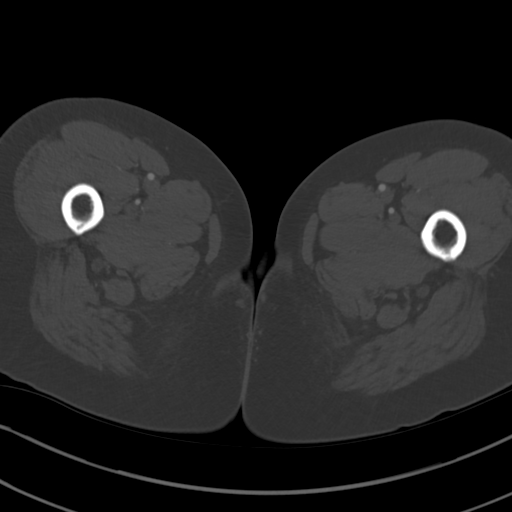
[im 12/93  soft-tissue]
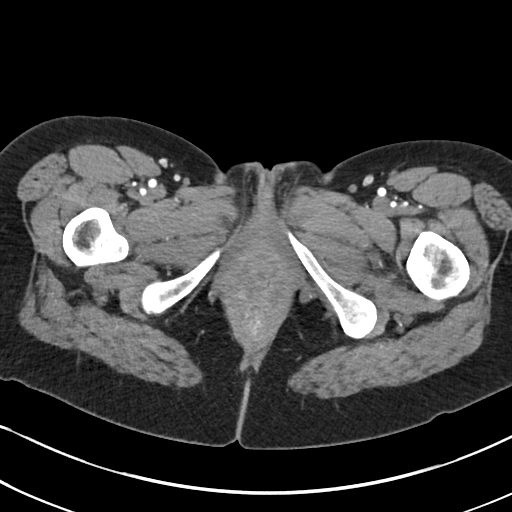
[im 19/93  soft-tissue]
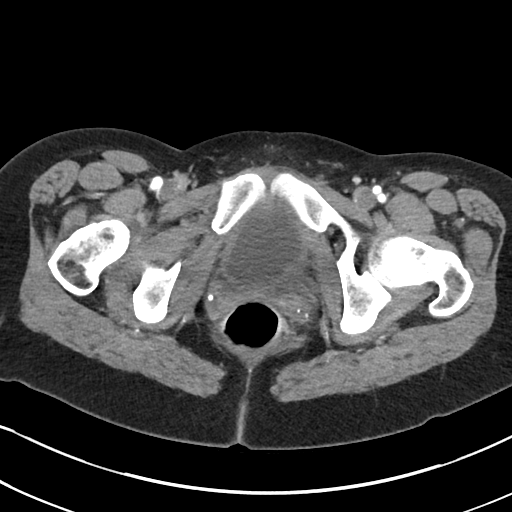
[im 26/93  soft-tissue]
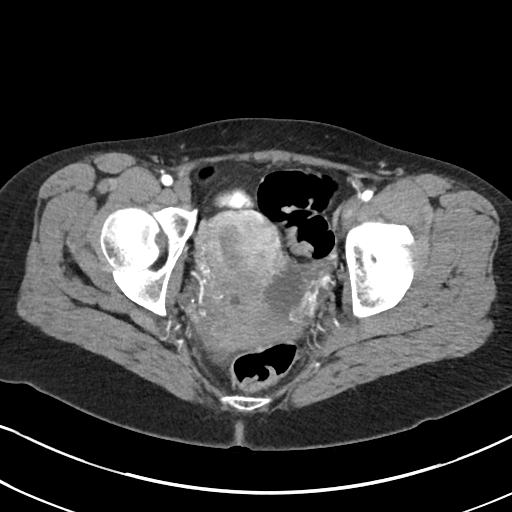
[im 34/93  soft-tissue]
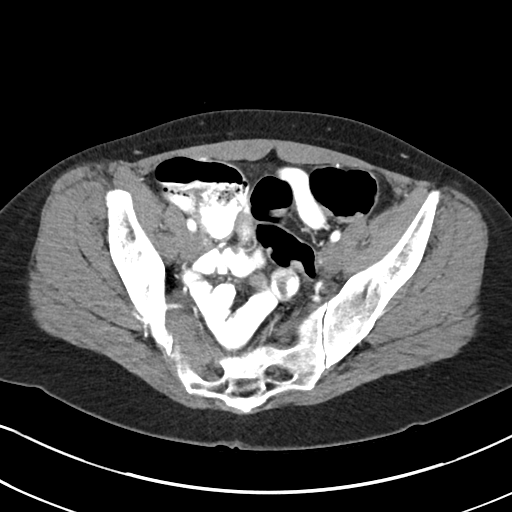
[im 41/93  soft-tissue]
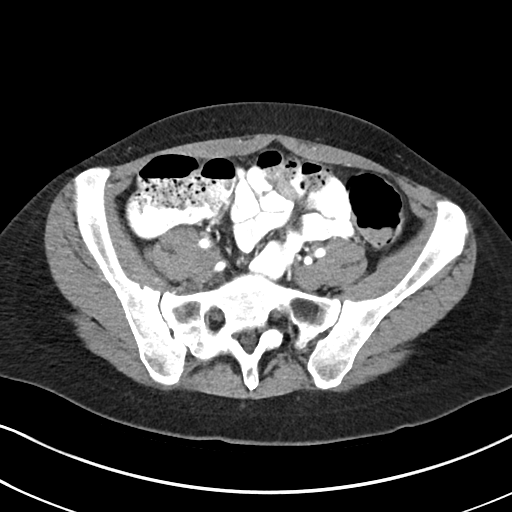
[im 48/93  soft-tissue]
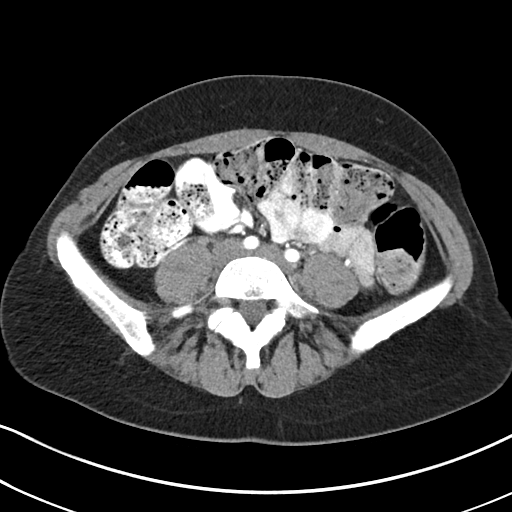
[im 52/93  soft-tissue]
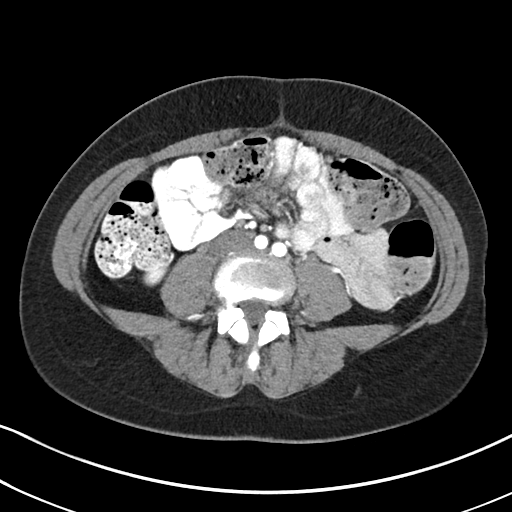
[im 59/93  soft-tissue]
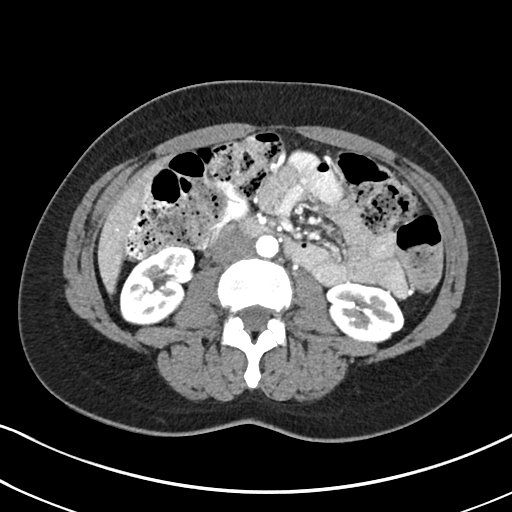
[im 59/93  bone]
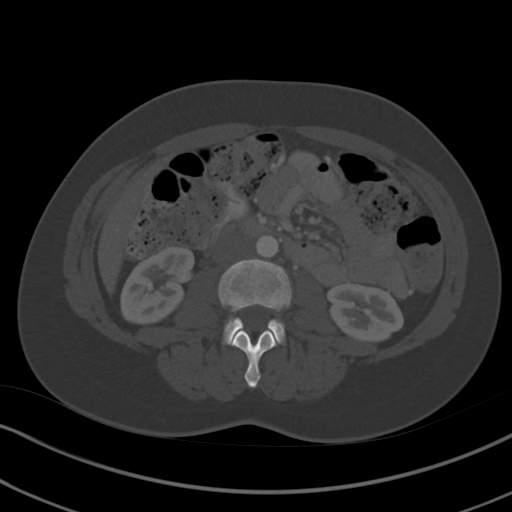
[im 67/93  soft-tissue]
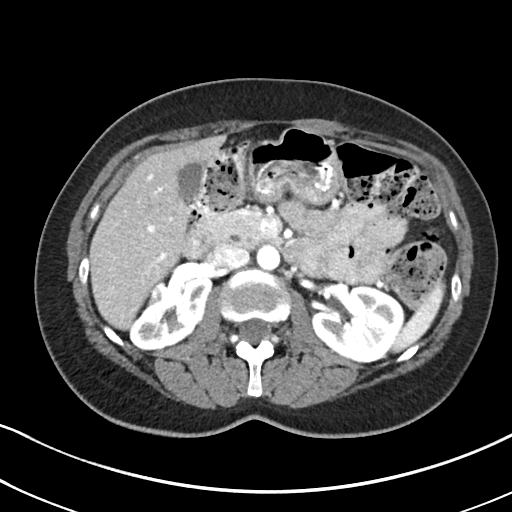
[im 74/93  soft-tissue]
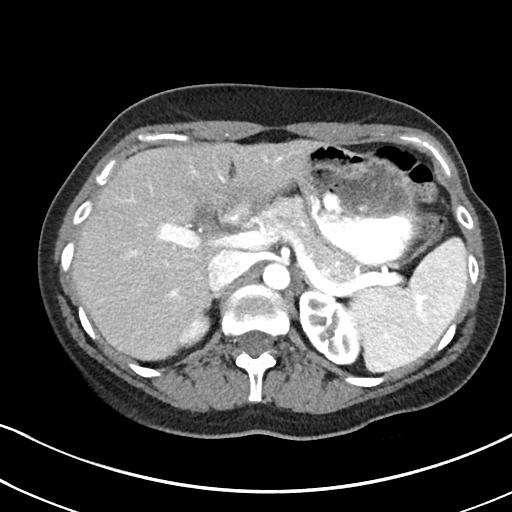
[im 81/93  soft-tissue]
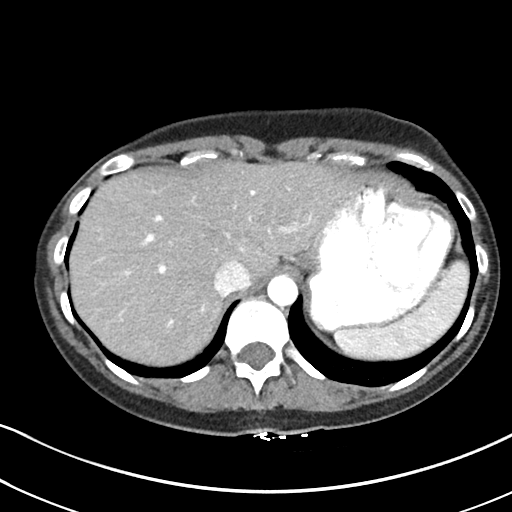
[im 89/93  soft-tissue]
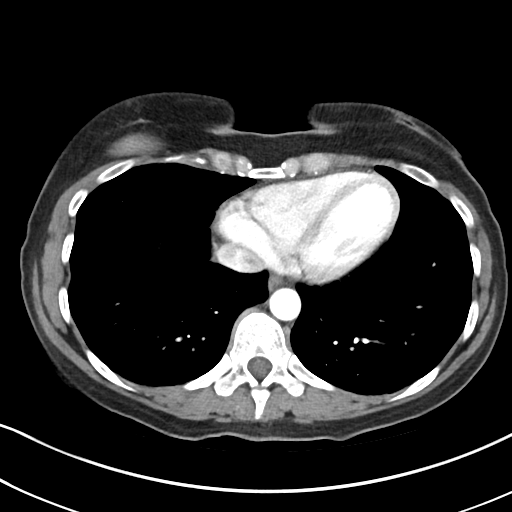

[Series 4: coronals abd pelvis 2.00 cor · coronal · 0.67mm/px · 3 of 130 slices shown]
[im 44/130  soft-tissue]
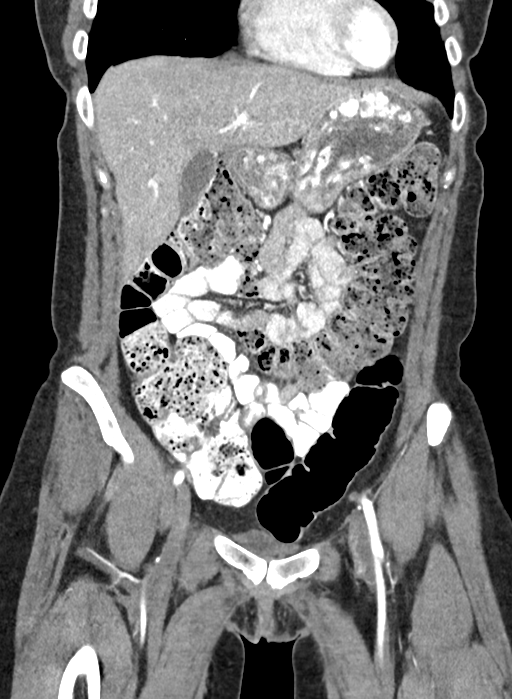
[im 58/130  soft-tissue]
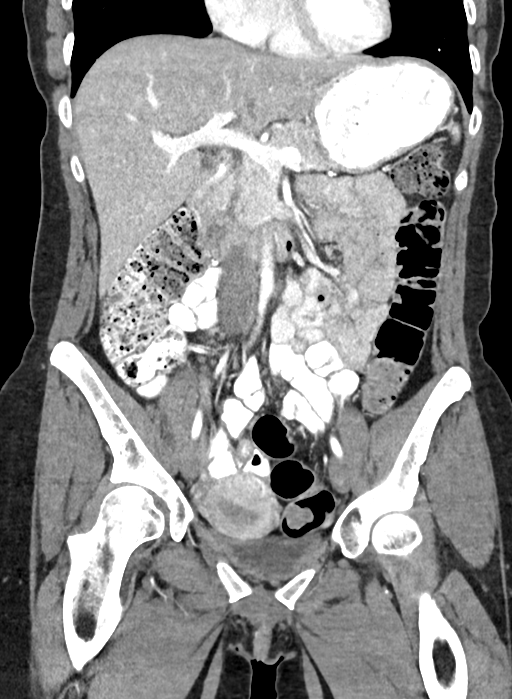
[im 72/130  soft-tissue]
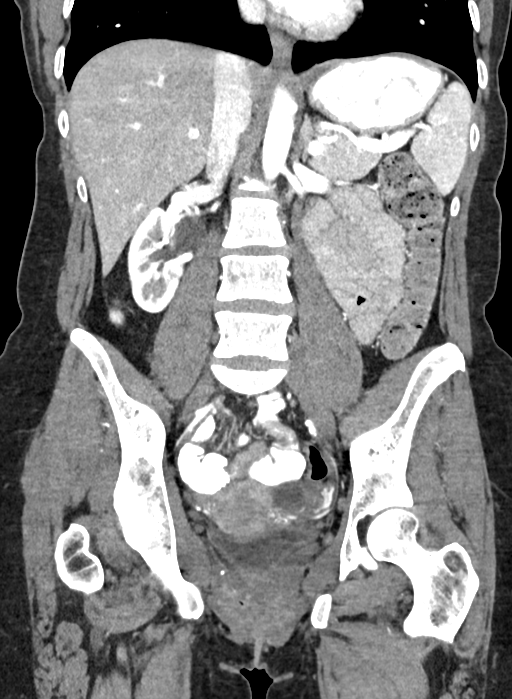

[16 of 46 positions shown; findings below may reference images not displayed]

FINDINGS: Lower Chest: No acute findings.

Hepatobiliary: No hepatic masses identified. Gallbladder is
unremarkable. No evidence of biliary ductal dilatation.

Pancreas:  No mass or inflammatory changes.

Spleen: Within normal limits in size and appearance.

Adrenals/Urinary Tract: No masses identified. Mild right renal
parenchymal scarring noted. No evidence of ureteral calculi or
hydronephrosis.

Stomach/Bowel: No evidence of obstruction, inflammatory process or
abnormal fluid collections. Large stool burden seen throughout the
colon.

Vascular/Lymphatic: No pathologically enlarged lymph nodes. No acute
vascular findings.

Reproductive: Normal appearance of uterus. A 3.6 cm simple appearing
cyst is seen in the left ovary which is new or increased since prior
study. A small left ovarian corpus luteum is also noted. No complex
cystic or solid masses identified. No evidence of free fluid.

Other:  None.

Musculoskeletal:  No suspicious bone lesions identified.
IMPRESSION: 3.6 cm benign-appearing left ovarian cyst, most likely physiologic
in a reproductive age female. No follow-up imaging recommended.
Note: This recommendation does not apply to premenarchal patients
and to those with increased risk (genetic, family history, elevated
tumor markers or other high-risk factors) of ovarian cancer.
Reference: JACR [DATE]):[id number]

Mild right renal parenchymal scarring. No evidence of ureteral
calculi or hydronephrosis.

Large stool burden noted; recommend clinical correlation for
possible constipation.

## 2022-12-01 ENCOUNTER — Ambulatory Visit: Payer: No Typology Code available for payment source

## 2022-12-01 DIAGNOSIS — K6389 Other specified diseases of intestine: Secondary | ICD-10-CM | POA: Diagnosis present

## 2022-12-01 DIAGNOSIS — K2950 Unspecified chronic gastritis without bleeding: Secondary | ICD-10-CM | POA: Diagnosis not present

## 2022-12-01 DIAGNOSIS — K2100 Gastro-esophageal reflux disease with esophagitis, without bleeding: Secondary | ICD-10-CM | POA: Diagnosis not present

## 2022-12-17 ENCOUNTER — Telehealth: Payer: PRIVATE HEALTH INSURANCE | Admitting: Nurse Practitioner

## 2022-12-17 DIAGNOSIS — J0101 Acute recurrent maxillary sinusitis: Secondary | ICD-10-CM

## 2022-12-17 MED ORDER — AZITHROMYCIN 250 MG PO TABS: ORAL_TABLET | ORAL | 0 refills | Status: DC

## 2022-12-17 NOTE — Patient Instructions (Signed)
  Natalie Wiggins, thank you for joining Chevis Pretty, FNP for today's virtual visit.  While this provider is not your primary care provider (PCP), if your PCP is located in our provider database this encounter information will be shared with them immediately following your visit.   [facility] account gives you access to today's visit and all your visits, tests, and labs performed at [facility] " click here if you don't have a [facility] account or go to [email or web address]  Consent: (Patient) Natalie Wiggins provided verbal consent for this virtual visit at the beginning of the encounter.  Current Medications:  Current Outpatient Medications:    azithromycin (ZITHROMAX Z-PAK) 250 MG tablet, As directed, Disp: 6 tablet, Rfl: 0   ibuprofen (ADVIL) 600 MG tablet, Take 1 tablet (600 mg total) by mouth every 6 (six) hours., Disp: 30 tablet, Rfl: 0   magnesium 30 MG tablet, Take 30 mg by mouth as needed., Disp: , Rfl:    ondansetron (ZOFRAN-ODT) 8 MG disintegrating tablet, Take 1 tablet (8 mg total) by mouth every 8 (eight) hours as needed for nausea or vomiting., Disp: 30 tablet, Rfl: 1   Medications ordered in this encounter:  Meds ordered this encounter  Medications   azithromycin (ZITHROMAX Z-PAK) 250 MG tablet    Sig: As directed    Dispense:  6 tablet    Refill:  0    Order Specific Question:   Supervising Provider    Answer:   Chase Picket [id number]     *If you need refills on other medications prior to your next appointment, please contact your pharmacy*  Follow-Up: Call back or seek an in-person evaluation if the symptoms worsen or if the condition fails to improve as anticipated.  [place] [phone]  Other Instructions 1. Take meds as prescribed 2. Use a cool mist humidifier especially during the winter months and when heat has been humid. 3. Use saline nose sprays frequently 4. Saline irrigations of the  nose can be very helpful if done frequently.  * 4X daily for 1 week*  * Use of a nettie pot can be helpful with this. Follow directions with this* 5. Drink plenty of fluids 6. Keep thermostat turn down low 7.For any cough or congestion- mucinex d 8. For fever or aces or pains- take tylenol or ibuprofen appropriate for age and weight.  * for fevers greater than 101 orally you may alternate ibuprofen and tylenol every  3 hours.      If you have been instructed to have an in-person evaluation today at a local Urgent Care facility, please use the link below. It will take you to a list of all of our available [facility], including address, phone number and hours of operation. Please do not delay care.  [facility]  If you or a family member do not have a primary care provider, use the link below to schedule a visit and establish care. When you choose a [facility] primary care physician or advanced practice provider, you gain a long-term partner in health. Find a Primary Care Provider  Learn more about [facility]'s in-office and virtual care options: [facility]

## 2022-12-17 NOTE — Progress Notes (Signed)
Virtual Visit Consent   Natalie Wiggins, you are scheduled for a virtual visit with Mary-Margaret Hassell Done, [place], a [facility] provider, today.     Just as with appointments in the office, your consent must be obtained to participate.  Your consent will be active for this visit and any virtual visit you may have with one of our providers in the next 365 days.     If you have a MyChart account, a copy of this consent can be sent to you electronically.  All virtual visits are billed to your insurance company just like a traditional visit in the office.    As this is a virtual visit, video technology does not allow for your provider to perform a traditional examination.  This may limit your provider's ability to fully assess your condition.  If your provider identifies any concerns that need to be evaluated in person or the need to arrange testing (such as labs, EKG, etc.), we will make arrangements to do so.     Although advances in technology are sophisticated, we cannot ensure that it will always work on either your end or our end.  If the connection with a video visit is poor, the visit may have to be switched to a telephone visit.  With either a video or telephone visit, we are not always able to ensure that we have a secure connection.     I need to obtain your verbal consent now.   Are you willing to proceed with your visit today? YES   Maynard Hoese has provided verbal consent on 12/17/2022 for a virtual visit (video or telephone).   Mary-Margaret Hassell Done, FNP   Date: 12/17/2022 8:35 AM   Virtual Visit via Video Note   I, Mary-Margaret Hassell Done, connected with Natalie Wiggins ([id number], [birth year]) on 12/17/22 at  9:30 AM EST by a video-enabled telemedicine application and verified that I am speaking with the correct person using two identifiers.  Location: Patient: Virtual Visit Location Patient: Home Provider: Virtual Visit Location Provider: Mobile   I discussed the limitations of  evaluation and management by telemedicine and the availability of in person appointments. The patient expressed understanding and agreed to proceed.    History of Present Illness: Natalie Wiggins is a 42 y.o. who identifies as a female who was assigned female at birth, and is being seen today for sinusitis .  HPI: Sinusitis This is a new problem. The current episode started 1 to 4 weeks ago. The problem has been waxing and waning since onset. The maximum temperature recorded prior to her arrival was 102 - 102.9 F. The fever has been present for 1 to 2 days. Her pain is at a severity of 9/10. The pain is moderate. Associated symptoms include congestion, coughing, headaches and sinus pressure. Pertinent negatives include no sore throat. Past treatments include nasal decongestants. The treatment provided moderate relief.    Review of Systems  HENT:  Positive for congestion and sinus pressure. Negative for sore throat.   Respiratory:  Positive for cough.   Neurological:  Positive for headaches.    Problems:  Patient Active Problem List   Diagnosis Date Noted  . Encounter for other contraceptive management 03/10/2017  . Menorrhagia with irregular cycle 03/10/2017  . Left ovarian cyst 08/28/2016    Allergies:  Allergies  Allergen Reactions  . Amoxicillin Hives  . Other Other (See Comments)    Band-Aid's cause skin irritation   Medications:  Current Outpatient Medications:  .  ibuprofen (  ADVIL) 600 MG tablet, Take 1 tablet (600 mg total) by mouth every 6 (six) hours., Disp: 30 tablet, Rfl: 0 .  magnesium 30 MG tablet, Take 30 mg by mouth as needed., Disp: , Rfl:  .  ondansetron (ZOFRAN-ODT) 8 MG disintegrating tablet, Take 1 tablet (8 mg total) by mouth every 8 (eight) hours as needed for nausea or vomiting., Disp: 30 tablet, Rfl: 1  Observations/Objective: Patient is well-developed, well-nourished in no acute distress.  Resting comfortably  at home.  Head is normocephalic, atraumatic.   No labored breathing.  Speech is clear and coherent with logical content.  Patient is alert and oriented at baseline.  Maxillary sinus pressure bil   Assessment and Plan:  Natalie Wiggins in today with chief complaint of Sinusitis   1. Acute recurrent maxillary sinusitis 1. Take meds as prescribed 2. Use a cool mist humidifier especially during the winter months and when heat has been humid. 3. Use saline nose sprays frequently 4. Saline irrigations of the nose can be very helpful if done frequently.  * 4X daily for 1 week*  * Use of a nettie pot can be helpful with this. Follow directions with this* 5. Drink plenty of fluids 6. Keep thermostat turn down low 7.For any cough or congestion- mucinex d 8. For fever or aces or pains- take tylenol or ibuprofen appropriate for age and weight.  * for fevers greater than 101 orally you may alternate ibuprofen and tylenol every  3 hours.   Meds ordered this encounter  Medications  . azithromycin (ZITHROMAX Z-PAK) 250 MG tablet    Sig: As directed    Dispense:  6 tablet    Refill:  0    Order Specific Question:   Supervising Provider    Answer:   Chase Picket [id number]   Allergic to pcn, doxy makes her sick. Prefers zpak, which works well for her.    Follow Up Instructions: I discussed the assessment and treatment plan with the patient. The patient was provided an opportunity to ask questions and all were answered. The patient agreed with the plan and demonstrated an understanding of the instructions.  A copy of instructions were sent to the patient via MyChart.  The patient was advised to call back or seek an in-person evaluation if the symptoms worsen or if the condition fails to improve as anticipated.  Time:  I spent 6 minutes with the patient via telehealth technology discussing the above problems/concerns.    Mary-Margaret Hassell Done, FNP

## 2022-12-24 IMAGING — CR DG CHEST 2V
2 series · 2 of 2 positions shown · non-contrast
Comparison: 10/14/2018

CLINICAL DATA: Right upper thoracic pain.

EXAM:
CHEST - 2 VIEW

[chest pa]
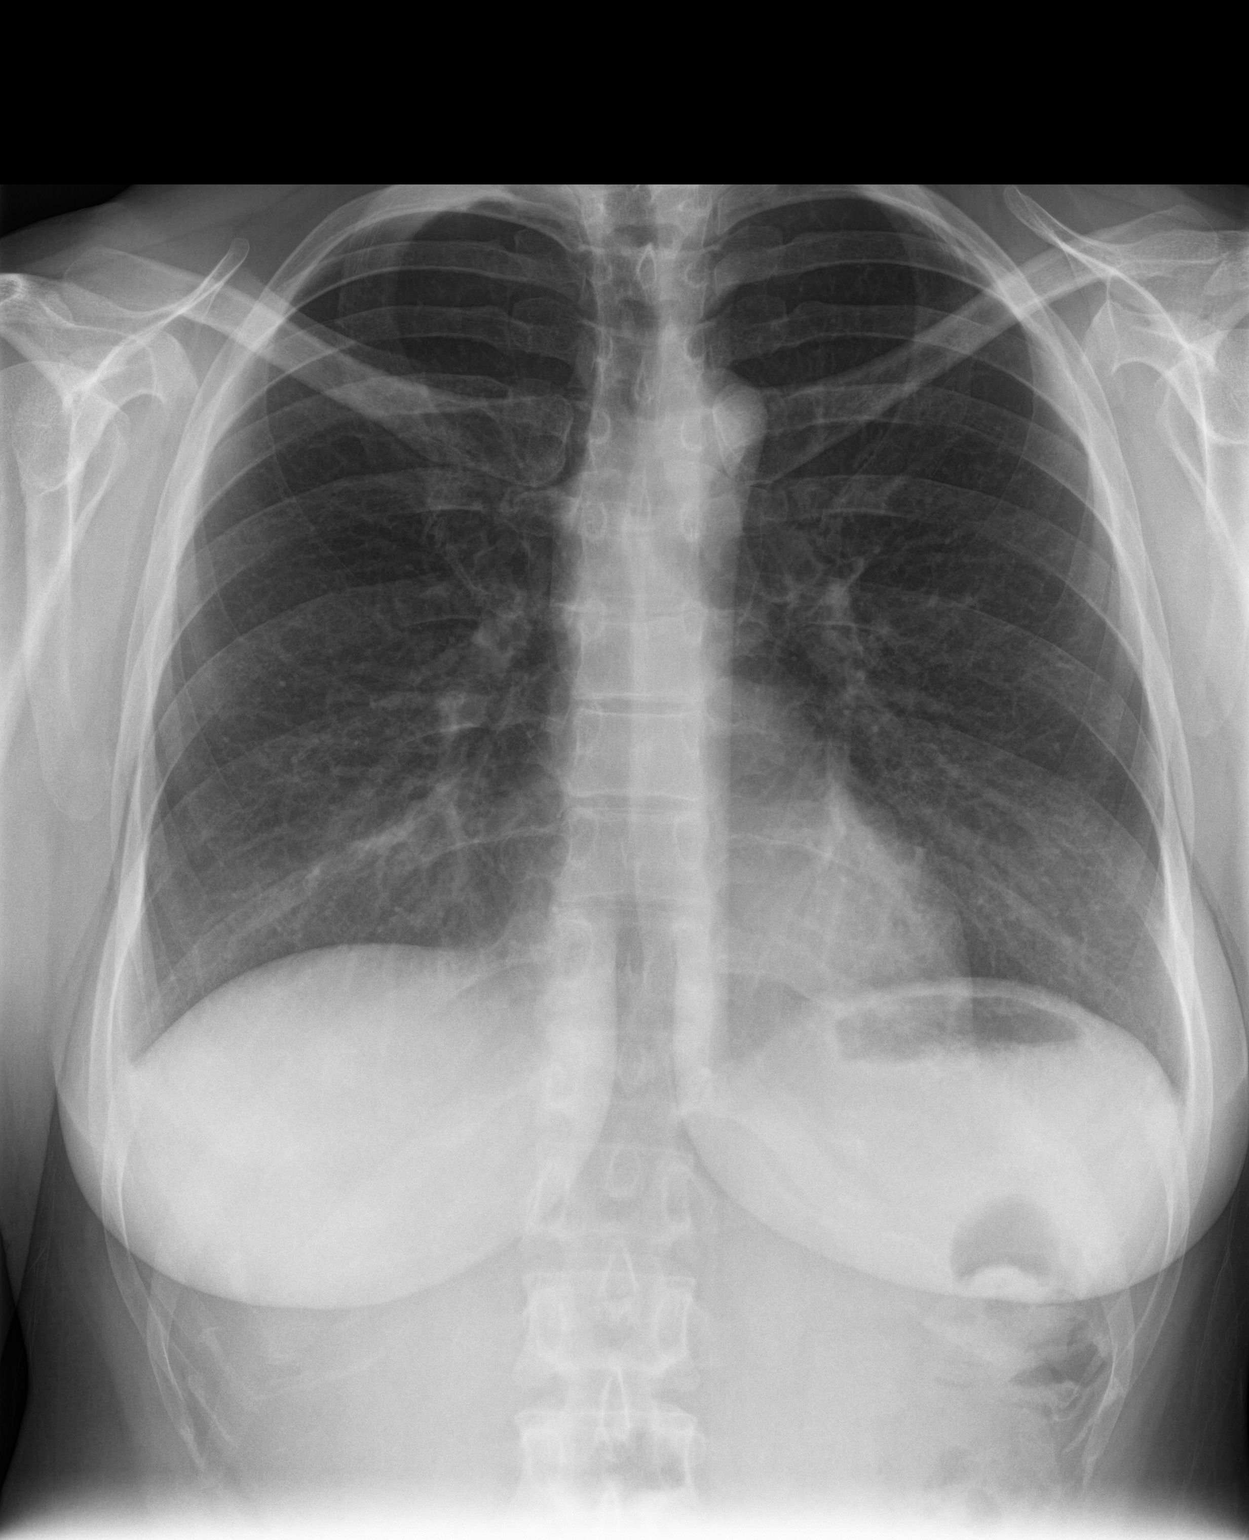

[chest lat]
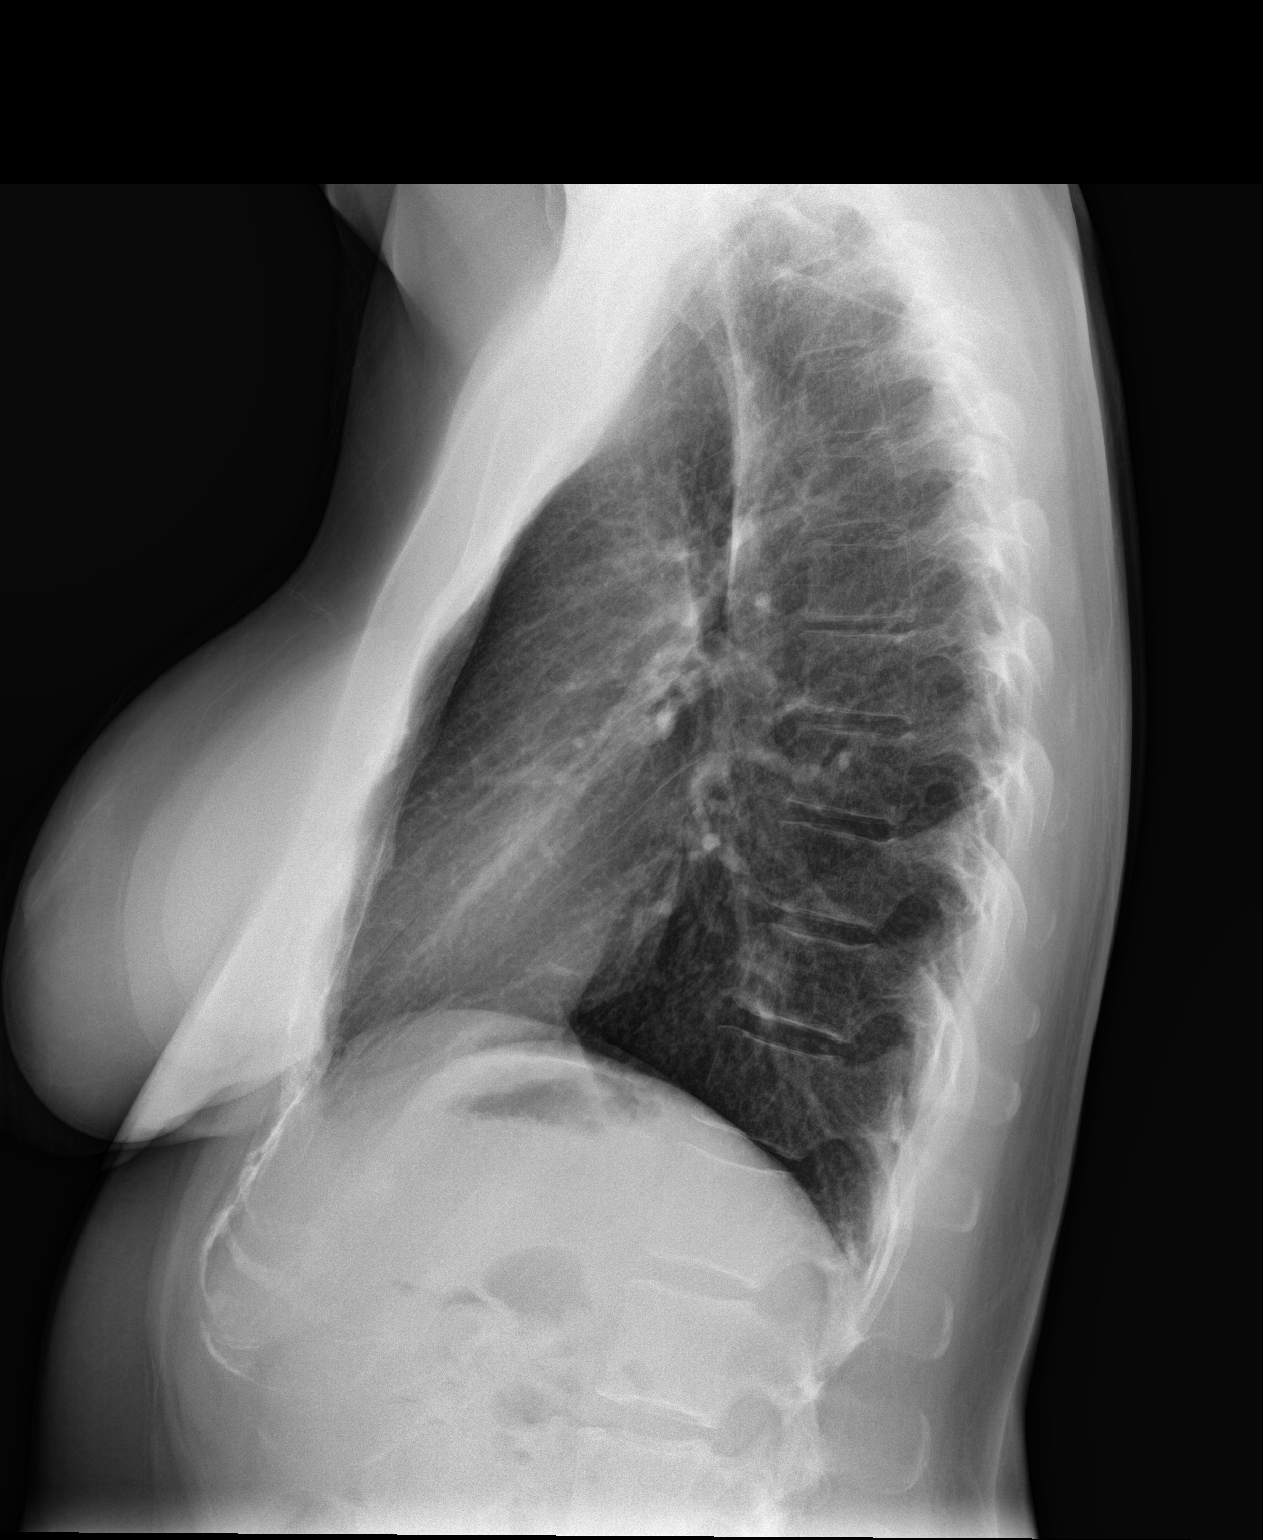

[2 of 2 positions shown; findings below may reference images not displayed]

FINDINGS: Heart size and mediastinal contours are normal. No pleural effusion
or edema identified. No airspace consolidation. Visualized osseous
structures are unremarkable.
IMPRESSION: No active cardiopulmonary abnormalities.

## 2023-09-02 ENCOUNTER — Ambulatory Visit
Admission: RE | Admit: 2023-09-02 | Discharge: 2023-09-02 | Disposition: A | Discharge status: Home / Self Care | Payer: PRIVATE HEALTH INSURANCE | Source: Ambulatory Visit | Attending: Family Medicine | Admitting: Family Medicine

## 2023-09-02 ENCOUNTER — Ambulatory Visit: Payer: PRIVATE HEALTH INSURANCE

## 2023-09-02 ENCOUNTER — Other Ambulatory Visit: Payer: Self-pay | Admitting: Family Medicine

## 2023-09-02 DIAGNOSIS — M25562 Pain in left knee: Secondary | ICD-10-CM

## 2023-09-02 DIAGNOSIS — M79605 Pain in left leg: Secondary | ICD-10-CM | POA: Insufficient documentation

## 2023-09-03 ENCOUNTER — Other Ambulatory Visit: Payer: PRIVATE HEALTH INSURANCE

## 2023-09-30 ENCOUNTER — Other Ambulatory Visit: Payer: Self-pay | Admitting: Family Medicine

## 2023-09-30 DIAGNOSIS — G8929 Other chronic pain: Secondary | ICD-10-CM

## 2023-10-04 ENCOUNTER — Telehealth: Payer: PRIVATE HEALTH INSURANCE | Admitting: Family

## 2023-10-04 DIAGNOSIS — J208 Acute bronchitis due to other specified organisms: Secondary | ICD-10-CM

## 2023-10-04 DIAGNOSIS — B9689 Other specified bacterial agents as the cause of diseases classified elsewhere: Secondary | ICD-10-CM

## 2023-10-04 MED ORDER — AZITHROMYCIN 250 MG PO TABS: ORAL_TABLET | ORAL | 0 refills | Status: AC

## 2023-10-04 MED ORDER — BENZONATATE 100 MG PO CAPS: 100.0000 mg | ORAL_CAPSULE | Freq: Three times a day (TID) | ORAL | 0 refills | Status: AC | PRN

## 2023-10-04 NOTE — Progress Notes (Signed)
Virtual Visit Consent   Natalie Wiggins, you are scheduled for a virtual visit with a [facility] provider today. Just as with appointments in the office, your consent must be obtained to participate. Your consent will be active for this visit and any virtual visit you may have with one of our providers in the next 365 days. If you have a MyChart account, a copy of this consent can be sent to you electronically.  As this is a virtual visit, video technology does not allow for your provider to perform a traditional examination. This may limit your provider's ability to fully assess your condition. If your provider identifies any concerns that need to be evaluated in person or the need to arrange testing (such as labs, EKG, etc.), we will make arrangements to do so. Although advances in technology are sophisticated, we cannot ensure that it will always work on either your end or our end. If the connection with a video visit is poor, the visit may have to be switched to a telephone visit. With either a video or telephone visit, we are not always able to ensure that we have a secure connection.  By engaging in this virtual visit, you consent to the provision of healthcare and authorize for your insurance to be billed (if applicable) for the services provided during this visit. Depending on your insurance coverage, you may receive a charge related to this service.  I need to obtain your verbal consent now. Are you willing to proceed with your visit today? Natalie Wiggins has provided verbal consent on 10/04/2023 for a virtual visit (video or telephone). Jannifer Rodney, FNP  Date: 10/04/2023 6:36 PM  Virtual Visit via Video Note   I, Jannifer Rodney, connected with  Natalie Wiggins  ([id number], 11-Jan-1981) on 10/04/23 at  6:30 PM EST by a video-enabled telemedicine application and verified that I am speaking with the correct person using two identifiers.  Location: Patient: Virtual Visit Location Patient:  Car Provider: Virtual Visit Location Provider: Home Office   I discussed the limitations of evaluation and management by telemedicine and the availability of in person appointments. The patient expressed understanding and agreed to proceed.    History of Present Illness: Natalie Wiggins is a 42 y.o. who identifies as a female who was assigned female at birth, and is being seen today for sinus congestion and cough for over a week. Reports she was feeling better, but started feeling worse in the last few days.   HPI: Cough This is a new problem. The current episode started 1 to 4 weeks ago. The problem has been waxing and waning. The problem occurs every few minutes. The cough is Productive of purulent sputum and productive of brown sputum. Associated symptoms include ear congestion, a fever (low grade), headaches, nasal congestion, postnasal drip and rhinorrhea. Pertinent negatives include no chills, ear pain, myalgias, shortness of breath or wheezing. Associated symptoms comments: Sinus pressure . She has tried OTC cough suppressant and rest for the symptoms. The treatment provided mild relief.    Problems:  Patient Active Problem List   Diagnosis Date Noted   Encounter for other contraceptive management 03/10/2017   Menorrhagia with irregular cycle 03/10/2017   Left ovarian cyst 08/28/2016    Allergies:  Allergies  Allergen Reactions   Amoxicillin Hives   Other Other (See Comments)    Band-Aid's cause skin irritation   Medications:  Current Outpatient Medications:    azithromycin (ZITHROMAX) 250 MG tablet, Take 500 mg once, then 250  mg for four days, Disp: 6 tablet, Rfl: 0   benzonatate (TESSALON PERLES) 100 MG capsule, Take 1 capsule (100 mg total) by mouth 3 (three) times daily as needed., Disp: 20 capsule, Rfl: 0   ibuprofen (ADVIL) 600 MG tablet, Take 1 tablet (600 mg total) by mouth every 6 (six) hours., Disp: 30 tablet, Rfl: 0   magnesium 30 MG tablet, Take 30 mg by mouth as  needed., Disp: , Rfl:    ondansetron (ZOFRAN-ODT) 8 MG disintegrating tablet, Take 1 tablet (8 mg total) by mouth every 8 (eight) hours as needed for nausea or vomiting., Disp: 30 tablet, Rfl: 1  Observations/Objective: Patient is well-developed, well-nourished in no acute distress.  Resting comfortably   Head is normocephalic, atraumatic.  No labored breathing.  Speech is clear and coherent with logical content.  Patient is alert and oriented at baseline.  Dry cough  Assessment and Plan: 1. Acute bacterial bronchitis (Primary) - azithromycin (ZITHROMAX) 250 MG tablet; Take 500 mg once, then 250 mg for four days  Dispense: 6 tablet; Refill: 0 - benzonatate (TESSALON PERLES) 100 MG capsule; Take 1 capsule (100 mg total) by mouth 3 (three) times daily as needed.  Dispense: 20 capsule; Refill: 0  - Take meds as prescribed - Use a cool mist humidifier  -Use saline nose sprays frequently -Force fluids -For any cough or congestion  Use plain Mucinex- regular strength or max strength is fine -For fever or aces or pains- take tylenol or ibuprofen. -Throat lozenges if help -Follow up if symptoms worsen or do not improve   Follow Up Instructions: I discussed the assessment and treatment plan with the patient. The patient was provided an opportunity to ask questions and all were answered. The patient agreed with the plan and demonstrated an understanding of the instructions.  A copy of instructions were sent to the patient via MyChart unless otherwise noted below.     The patient was advised to call back or seek an in-person evaluation if the symptoms worsen or if the condition fails to improve as anticipated.    Jannifer Rodney, FNP

## 2023-10-05 ENCOUNTER — Ambulatory Visit: Admission: RE | Admit: 2023-10-05 | Payer: PRIVATE HEALTH INSURANCE | Source: Ambulatory Visit

## 2023-10-12 ENCOUNTER — Ambulatory Visit: Admission: RE | Admit: 2023-10-12 | Payer: PRIVATE HEALTH INSURANCE | Source: Ambulatory Visit

## 2024-09-27 ENCOUNTER — Other Ambulatory Visit: Payer: Self-pay | Admitting: Orthopedic Surgery

## 2024-09-27 DIAGNOSIS — M25852 Other specified joint disorders, left hip: Secondary | ICD-10-CM
